# Patient Record
Sex: Male | Born: 1940 | Race: White | Hispanic: No | Marital: Married | Smoking: Former smoker
Health system: Southern US, Community
[De-identification: ages and names within clinical notes are randomized; demographics above are authoritative.]

## PROBLEM LIST (undated history)

## (undated) DIAGNOSIS — I82629 Acute embolism and thrombosis of deep veins of unspecified upper extremity: Secondary | ICD-10-CM

## (undated) DIAGNOSIS — C760 Malignant neoplasm of head, face and neck: Secondary | ICD-10-CM

## (undated) DIAGNOSIS — I729 Aneurysm of unspecified site: Secondary | ICD-10-CM

## (undated) DIAGNOSIS — Z85118 Personal history of other malignant neoplasm of bronchus and lung: Secondary | ICD-10-CM

## (undated) DIAGNOSIS — K449 Diaphragmatic hernia without obstruction or gangrene: Secondary | ICD-10-CM

## (undated) DIAGNOSIS — C3490 Malignant neoplasm of unspecified part of unspecified bronchus or lung: Secondary | ICD-10-CM

## (undated) DIAGNOSIS — E7800 Pure hypercholesterolemia, unspecified: Secondary | ICD-10-CM

## (undated) DIAGNOSIS — I10 Essential (primary) hypertension: Secondary | ICD-10-CM

## (undated) DIAGNOSIS — J439 Emphysema, unspecified: Secondary | ICD-10-CM

## (undated) DIAGNOSIS — R918 Other nonspecific abnormal finding of lung field: Secondary | ICD-10-CM

## (undated) DIAGNOSIS — C779 Secondary and unspecified malignant neoplasm of lymph node, unspecified: Secondary | ICD-10-CM

## (undated) DIAGNOSIS — G4734 Idiopathic sleep related nonobstructive alveolar hypoventilation: Secondary | ICD-10-CM

## (undated) DIAGNOSIS — I739 Peripheral vascular disease, unspecified: Secondary | ICD-10-CM

## (undated) DIAGNOSIS — E119 Type 2 diabetes mellitus without complications: Secondary | ICD-10-CM

## (undated) DIAGNOSIS — I7101 Dissection of ascending aorta: Secondary | ICD-10-CM

## (undated) DIAGNOSIS — IMO0001 Reserved for inherently not codable concepts without codable children: Secondary | ICD-10-CM

## (undated) DIAGNOSIS — IMO0002 Reserved for concepts with insufficient information to code with codable children: Secondary | ICD-10-CM

## (undated) DIAGNOSIS — E785 Hyperlipidemia, unspecified: Secondary | ICD-10-CM

## (undated) DIAGNOSIS — Z952 Presence of prosthetic heart valve: Secondary | ICD-10-CM

## (undated) DIAGNOSIS — I714 Abdominal aortic aneurysm, without rupture: Secondary | ICD-10-CM

## (undated) DIAGNOSIS — Z85819 Personal history of malignant neoplasm of unspecified site of lip, oral cavity, and pharynx: Secondary | ICD-10-CM

## (undated) HISTORY — DX: Personal history of malignant neoplasm of unspecified site of lip, oral cavity, and pharynx: Z85.819

## (undated) HISTORY — PX: LUNG SURGERY: SHX703

## (undated) HISTORY — DX: Pure hypercholesterolemia, unspecified: E78.00

## (undated) HISTORY — DX: Type 2 diabetes mellitus without complications: E11.9

## (undated) HISTORY — PX: TONSILECTOMY/ADENOIDECTOMY WITH MYRINGOTOMY: SHX6125

## (undated) HISTORY — DX: Emphysema, unspecified: J43.9

## (undated) HISTORY — DX: Reserved for concepts with insufficient information to code with codable children: IMO0002

## (undated) HISTORY — PX: TUMOR REMOVAL: SHX12

## (undated) HISTORY — DX: Hyperlipidemia, unspecified: E78.5

## (undated) HISTORY — DX: Idiopathic sleep related nonobstructive alveolar hypoventilation: G47.34

## (undated) HISTORY — DX: Other nonspecific abnormal finding of lung field: R91.8

## (undated) HISTORY — DX: Personal history of other malignant neoplasm of bronchus and lung: Z85.118

## (undated) HISTORY — DX: Essential (primary) hypertension: I10

## (undated) HISTORY — PX: TONSILLECTOMY: SUR1361

## (undated) HISTORY — DX: Diaphragmatic hernia without obstruction or gangrene: K44.9

## (undated) HISTORY — PX: CARDIAC VALVE SURGERY: SHX40

## (undated) HISTORY — PX: AORTIC VALVE REPLACEMENT: SHX41

## (undated) HISTORY — DX: Malignant neoplasm of unspecified part of unspecified bronchus or lung: C34.90

## (undated) HISTORY — DX: Peripheral vascular disease, unspecified: I73.9

## (undated) HISTORY — PX: OTHER SURGICAL HISTORY: SHX169

## (undated) HISTORY — DX: Malignant neoplasm of head, face and neck: C76.0

## (undated) HISTORY — DX: Presence of prosthetic heart valve: Z95.2

## (undated) HISTORY — DX: Secondary and unspecified malignant neoplasm of lymph node, unspecified: C77.9

## (undated) HISTORY — DX: Reserved for inherently not codable concepts without codable children: IMO0001

## (undated) HISTORY — DX: Abdominal aortic aneurysm, without rupture: I71.4

---

## 2004-01-17 ENCOUNTER — Ambulatory Visit: Payer: Self-pay | Admitting: Family Medicine

## 2004-02-06 ENCOUNTER — Ambulatory Visit: Payer: Self-pay | Admitting: Family Medicine

## 2004-02-15 ENCOUNTER — Ambulatory Visit: Payer: Self-pay | Admitting: Family Medicine

## 2004-02-20 ENCOUNTER — Ambulatory Visit: Payer: Self-pay | Admitting: Family Medicine

## 2004-03-19 ENCOUNTER — Ambulatory Visit: Payer: Self-pay | Admitting: Family Medicine

## 2005-09-07 ENCOUNTER — Ambulatory Visit: Payer: Self-pay | Admitting: Oncology

## 2005-10-05 ENCOUNTER — Encounter: Payer: Self-pay | Admitting: Internal Medicine

## 2005-12-29 ENCOUNTER — Ambulatory Visit: Payer: Self-pay | Admitting: Oncology

## 2006-03-16 ENCOUNTER — Ambulatory Visit: Payer: Self-pay | Admitting: Oncology

## 2006-05-18 ENCOUNTER — Ambulatory Visit: Payer: Self-pay | Admitting: Oncology

## 2006-08-10 ENCOUNTER — Ambulatory Visit: Payer: Self-pay | Admitting: Oncology

## 2006-11-02 ENCOUNTER — Ambulatory Visit: Payer: Self-pay | Admitting: Oncology

## 2006-12-24 ENCOUNTER — Encounter: Payer: Self-pay | Admitting: Internal Medicine

## 2007-03-01 ENCOUNTER — Ambulatory Visit: Payer: Self-pay | Admitting: Cardiothoracic Surgery

## 2007-03-10 ENCOUNTER — Encounter: Payer: Self-pay | Admitting: Internal Medicine

## 2007-03-23 ENCOUNTER — Ambulatory Visit: Payer: Self-pay | Admitting: Cardiothoracic Surgery

## 2007-05-30 ENCOUNTER — Encounter: Payer: Self-pay | Admitting: Internal Medicine

## 2007-12-08 ENCOUNTER — Ambulatory Visit: Payer: Self-pay | Admitting: Internal Medicine

## 2007-12-08 DIAGNOSIS — E119 Type 2 diabetes mellitus without complications: Secondary | ICD-10-CM

## 2007-12-08 DIAGNOSIS — I10 Essential (primary) hypertension: Secondary | ICD-10-CM | POA: Insufficient documentation

## 2007-12-08 DIAGNOSIS — E785 Hyperlipidemia, unspecified: Secondary | ICD-10-CM | POA: Insufficient documentation

## 2007-12-08 DIAGNOSIS — R0602 Shortness of breath: Secondary | ICD-10-CM | POA: Insufficient documentation

## 2007-12-08 HISTORY — DX: Hyperlipidemia, unspecified: E78.5

## 2008-01-13 ENCOUNTER — Ambulatory Visit: Payer: Self-pay | Admitting: Internal Medicine

## 2008-01-13 DIAGNOSIS — J449 Chronic obstructive pulmonary disease, unspecified: Secondary | ICD-10-CM

## 2008-03-15 ENCOUNTER — Ambulatory Visit: Payer: Self-pay | Admitting: Internal Medicine

## 2009-01-10 ENCOUNTER — Ambulatory Visit: Payer: Self-pay | Admitting: Cardiothoracic Surgery

## 2009-01-15 ENCOUNTER — Ambulatory Visit (HOSPITAL_COMMUNITY): Admission: RE | Admit: 2009-01-15 | Discharge: 2009-01-15 | Payer: Self-pay | Admitting: Cardiothoracic Surgery

## 2009-01-17 ENCOUNTER — Encounter: Admission: RE | Admit: 2009-01-17 | Discharge: 2009-01-17 | Payer: Self-pay | Admitting: Cardiothoracic Surgery

## 2009-01-17 ENCOUNTER — Ambulatory Visit: Payer: Self-pay | Admitting: Cardiothoracic Surgery

## 2009-01-28 ENCOUNTER — Ambulatory Visit: Payer: Self-pay | Admitting: Cardiothoracic Surgery

## 2009-03-06 ENCOUNTER — Ambulatory Visit: Payer: Self-pay | Admitting: Cardiothoracic Surgery

## 2009-03-14 ENCOUNTER — Ambulatory Visit: Payer: Self-pay | Admitting: Cardiothoracic Surgery

## 2009-05-16 ENCOUNTER — Encounter: Admission: RE | Admit: 2009-05-16 | Discharge: 2009-05-16 | Payer: Self-pay | Admitting: Cardiothoracic Surgery

## 2009-05-23 ENCOUNTER — Ambulatory Visit: Payer: Self-pay | Admitting: Cardiothoracic Surgery

## 2009-05-30 ENCOUNTER — Ambulatory Visit: Payer: Self-pay | Admitting: Cardiothoracic Surgery

## 2009-06-05 ENCOUNTER — Ambulatory Visit: Payer: Self-pay | Admitting: Cardiothoracic Surgery

## 2009-06-05 ENCOUNTER — Ambulatory Visit (HOSPITAL_COMMUNITY): Admission: RE | Admit: 2009-06-05 | Discharge: 2009-06-05 | Payer: Self-pay | Admitting: Cardiothoracic Surgery

## 2009-06-18 ENCOUNTER — Ambulatory Visit (HOSPITAL_COMMUNITY): Admission: RE | Admit: 2009-06-18 | Discharge: 2009-06-18 | Payer: Self-pay | Admitting: Oncology

## 2010-02-12 ENCOUNTER — Encounter: Payer: Self-pay | Admitting: Internal Medicine

## 2010-02-13 ENCOUNTER — Ambulatory Visit: Payer: Self-pay | Admitting: Internal Medicine

## 2010-02-13 DIAGNOSIS — J8410 Pulmonary fibrosis, unspecified: Secondary | ICD-10-CM

## 2010-03-20 ENCOUNTER — Ambulatory Visit
Admission: RE | Admit: 2010-03-20 | Discharge: 2010-03-20 | Payer: Self-pay | Source: Home / Self Care | Attending: Internal Medicine | Admitting: Internal Medicine

## 2010-03-20 ENCOUNTER — Encounter: Payer: Self-pay | Admitting: Internal Medicine

## 2010-03-20 DIAGNOSIS — R930 Abnormal findings on diagnostic imaging of skull and head, not elsewhere classified: Secondary | ICD-10-CM | POA: Insufficient documentation

## 2010-04-03 NOTE — Assessment & Plan Note (Signed)
Summary: Pulmonary/ ext ov with hfa/ dpi teaching, restart spiriva   Copy to:  Dr. Josiah Lobo Primary Provider/Referring Provider:  Dr. Elray Mcgregor  CC:  PNA- seen at [facility] in [place].  History of Present Illness: 70 yowm quit smoking  since 2003 due to sob and coughing s/p partial right lower lobe resection at [facility] March 2009 for ca   December 08, 2007 pulmonary consultation for doe x walking across the yard, uphill coming back to the house slowly without stopping until back to house x > 1 year, assoc with hoarseness and day >> night dry cough,  no better on various nebulizers, may have tried spiriva but not sure so rx with spiriva  January 13, 2008 returns with improved ex tol, no increase cough.  worked on inhaler technique  March 15, 2008 ov alot better in terms of ex tolerance, rarely uses xopenex. no cough. stopped spiriva subsequent to ov due to cost.  February 13, 2010 ov cc doe  x 1 year esp p RT and chemo last rx  Summer 2011 doe x morning routine [employer] on bugging, does not use hc parking,  uses 02 just at night.  minimal cough/ congestion with recent flare with discolored mucus dx with possible pna 12/14 on levaquin and prednisone.   Current Medications (verified): 1)  Micardis Hct 80-25 Mg Tabs (Telmisartan-Hctz) .Marland Kitchen.. 1 Once Daily As Needed 2)  Clonazepam 1 Mg Tabs (Clonazepam) .Marland Kitchen.. 1 Two Times A Day As Needed 3)  Simvastatin 40 Mg Tabs (Simvastatin) .Marland Kitchen.. 1 Once Daily 4)  Aspirin 325 Mg Tabs (Aspirin) .Marland Kitchen.. 1 Once Daily 5)  Perforomist 20 Mcg/50ml Nebu (Formoterol Fumarate) .Marland Kitchen.. 1 in Nebulizer Two Times A Day 6)  Pulmicort 0.5 Mg/51ml Susp (Budesonide) .Marland Kitchen.. 1 in Nebulizer Two Times A Day 7)  Xopenex Hfa 45 Mcg/act Aero (Levalbuterol Tartrate) .... 2 Puffs Every 4 Hours If Needed For Short of Breath 8)  Digoxin 0.125 Mg Tabs (Digoxin) .Marland Kitchen.. 1 Once Daily 9)  Metoprolol Tartrate 25 Mg Tabs (Metoprolol Tartrate) .... 1/2 Two Times A Day 10)  Prednisone 10 Mg Tabs  (Prednisone) .... Tapered Dose Per Dr Manson Passey 11)  Levaquin 500 Mg Tabs (Levofloxacin) .... As Directed Per Dr Manson Passey 12)  Hydrocodone-Homatropine 5-1.5 Mg Tabs (Hydrocodone-Homatropine) .Marland Kitchen.. 1 Tsp 4 Times Per Day As Needed  Allergies (verified): No Known Drug Allergies  Past History:  Past Medical History:  DYSPNEA (ICD-786.05) AORTIC STENOSIS (ICD-424.1)    - Echo 03/10/2007 mod calcific aortic stenosis with mild regurg, nl LA DIABETES MELLITUS, TYPE II (ICD-250.00) DYSLIPIDEMIA (ICD-272.4) ESSENTIAL HYPERTENSION (ICD-401.9) COPD     - 70 yowm quit smoking technique 01/13/08     - PFT's March 15, 2008 FEV1 1.31 (46%) ratio 36 ,  with 14 % response to B2,  54% DLC0    - Restart spiriva February 13, 2010 x 30 day trial >>> Obesity      - Target wt  = 190   for BMI < 30   Vital Signs:  Patient profile:   70 year old male Weight:      198.25 pounds BMI:     30.25 O2 Sat:      92 % on Room air Temp:     97.5 degrees F oral Pulse rate:   110 / minute BP sitting:   108 / 60  (left arm)  Vitals Entered By: Vernie Murders (February 13, 2010 11:52 AM)  O2 Flow:  Room air  Physical Exam  Additional Exam:  in general he is an ambulatory obese white male nad wt 207 December 08, 2007  > 208 11.13.09 > 203 March 15, 2008  > 203  HEENT mild turbinate edema.  Oropharynx full dentures no thrush or excess pnd or cobblestoning.  No JVD or cervical adenopathy. Mild accessory muscle hypertrophy. Trachea midline, nl thryroid. Chest was hyperinflated by percussion with diminished breath sounds and moderate increased exp time without wheeze. Hoover sign positive at mid inspiration. Regular rate and rhythm with II-II/VI systolic ejection murmur, but no gallop or rub or increase P2.  Abd: no hsm, nl excursion. Ext warm without C,C or E.     CXR  Procedure date:  02/12/2010  Findings:      Mod size HH ? rml infiltrat and chronic undrlying PF  Impression & Recommendations:  Problem # 1:  COPD  UNSPECIFIED (ICD-496) DDX of  difficult airways managment all start with A and  include Adherence, Ace Inhibitors, Acid Reflux, Active Sinus Disease, Alpha 1 Antitripsin deficiency, Anxiety masquerading as Airways dz,  ABPA,  allergy(esp in young), Aspiration (esp in elderly), Adverse effects of DPI,  Active smokers, plus one B  = Beta blocker use..    Adherence very questionable, limited insight into meds. Needs rechallenge with spiriva.  I spent extra time with the patient today explaining optimal  dpi  technique.  This improved from  50-90%  ? acid reflux consider adding next ov   Each maintenance medication was reviewed in detail including most importantly the difference between maintenance and as needed and under what circumstances the prns are to be used.   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 related to ALI or RT/ chemo, needs f/u cxr and pft's.   CAP covered with levaquiin  Medications Added to Medication List This Visit: 1)  Simvastatin 40 Mg Tabs (Simvastatin) .Marland Kitchen.. 1 once daily 2)  Aspirin 325 Mg Tabs (Aspirin) .Marland Kitchen.. 1 once daily 3)  Digoxin 0.125 Mg Tabs (Digoxin) .Marland Kitchen.. 1 once daily 4)  Metoprolol Tartrate 25 Mg Tabs (Metoprolol tartrate) .... 1/2 two times a day 5)  Prednisone 10 Mg Tabs (Prednisone) .... Tapered dose per dr brown 6)  Levaquin 500 Mg Tabs (Levofloxacin) .... As directed per dr brown 7)  Hydrocodone-homatropine 5-1.5 Mg Tabs (Hydrocodone-homatropine) .Marland Kitchen.. 1 tsp 4 times per day as needed 8)  Spiriva Handihaler 18 Mcg Caps (Tiotropium bromide monohydrate) .... Two puffs in handihaler daily  Other Orders: Est. Patient Level IV (81191) HFA Instruction [phone])  Patient Instructions: 1)  Bring all your medications in two bags, the ones you use no matter what vs the ones you take only if you feel need them 2)  Spiriva one capsule each am  3)  Please schedule a follow-up appointment  in 4 weeks, sooner if needed cxr and pft's on return

## 2010-04-03 NOTE — Miscellaneous (Signed)
Summary: Orders Update pft charges   Clinical Lists Changes  Orders: Added new Service order of Carbon Monoxide diffusing w/capacity (94729) - Signed Added new Service order of Lung Volumes/Gas dilution or washout (94727) - Signed Added new Service order of Spirometry (Pre & Post) (94060) - Signed 

## 2010-04-03 NOTE — Assessment & Plan Note (Addendum)
Summary: Pulmonary/ ext f/u ov with hfa 90% p coaching   Copy to:  Dr. Josiah Lobo Primary Provider/Referring Provider:  Dr. Elray Mcgregor  CC:  Dyspnea- slightly better.  History of Present Illness: 31 yowm quit smoking  since 2003 due to sob and coughing s/p partial right lower lobe resection at [facility] March 2009 for ca with recurrence actively being treated  in [place] with chemo and RT  December 08, 2007 pulmonary consultation for doe x walking across the yard, uphill coming back to the house slowly without stopping until back to house x > 1 year, assoc with hoarseness and day >> night dry cough,  no better on various nebulizers, may have tried spiriva but not sure so rx with spiriva  January 13, 2008 returns with improved ex tol, no increase cough.  worked on inhaler technique  March 15, 2008 ov alot better in terms of ex tolerance, rarely uses xopenex. no cough. stopped spiriva subsequent to ov due to cost.  February 13, 2010 ov cc doe  x 1 year esp p RT and chemo last rx  Summer 2011 doe x morning routine [employer] on bugging, does not use hc parking,  uses 02 just at night.  minimal cough/ congestion with recent flare with discolored mucus dx with possible pna 12/14 on levaquin and prednisone.  rec rechallenge with spiriva since helped in past  March 20, 2010 ov doe slt better x does ok at [employer] as long as takes his time.  Pt denies any significant sore throat, dysphagia, itching, sneezing,  nasal congestion or excess secretions,  fever, chills, sweats, unintended wt loss, pleuritic or exertional cp, hempoptysis, change in activity tolerance  orthopnea pnd or leg swelling. Pt also denies any obvious fluctuation in symptoms with weather or environmental change or other alleviating or aggravating factors.       Current Medications (verified): 1)  Micardis Hct 80-25 Mg Tabs (Telmisartan-Hctz) .Marland Kitchen.. 1 Once Daily As Needed 2)  Clonazepam 1 Mg Tabs (Clonazepam) .Marland Kitchen.. 1 Two Times A  Day As Needed 3)  Crestor 10 Mg Tabs (Rosuvastatin Calcium) .Marland Kitchen.. 1 Once Daily 4)  Aspirin 325 Mg Tabs (Aspirin) .Marland Kitchen.. 1 Once Daily 5)  Perforomist 20 Mcg/55ml Nebu (Formoterol Fumarate) .Marland Kitchen.. 1 in Nebulizer Two Times A Day 6)  Pulmicort 0.5 Mg/83ml Susp (Budesonide) .Marland Kitchen.. 1 in Nebulizer Two Times A Day 7)  Xopenex Hfa 45 Mcg/act Aero (Levalbuterol Tartrate) .... 2 Puffs Every 4 Hours If Needed For Short of Breath 8)  Digoxin 0.125 Mg Tabs (Digoxin) .Marland Kitchen.. 1 Once Daily 9)  Metoprolol Tartrate 25 Mg Tabs (Metoprolol Tartrate) .... 1/2 Two Times A Day 10)  Spiriva Handihaler 18 Mcg  Caps (Tiotropium Bromide Monohydrate) .... Two Puffs in Handihaler Daily  Allergies (verified): No Known Drug Allergies  Past History:  Past Medical History:  DYSPNEA (ICD-786.05) AORTIC STENOSIS (ICD-424.1)    - Echo 03/10/2007 mod calcific aortic stenosis with mild regurg, nl LA DIABETES MELLITUS, TYPE II (ICD-250.00) DYSLIPIDEMIA (ICD-272.4) ESSENTIAL HYPERTENSION (ICD-401.9) COPD     - Mastered hfa technique 01/13/08     - PFT's March 15, 2008 FEV1 1.31 (46%) ratio 36 ,  with 14 % response to B2,  54% DLC0    - Restart spiriva February 13, 2010 x 30 day trial >>> better    -PFT's 01/19/11:  FEV1 1.22 (44%) ratio 35 no better with B2 and DLCO 56 corrects to 72% Lung ca, squamous cell     - s/p wedge resection  [facility] March 2009     - Recurrence 2011 rx in [place], RT and Chemo Obesity      - Target wt  = 190   for BMI < 30   Vital Signs:  Patient profile:   70 year old male Weight:      198 pounds O2 Sat:      91 % on Room air Temp:     97.7 degrees F oral Pulse rate:   105 / minute BP sitting:   110 / 78  (right arm)  Vitals Entered By: Vernie Murders (March 20, 2010 11:04 AM)  O2 Flow:  Room air  Physical Exam  Additional Exam:  in general he is an ambulatory obese white male nad wt 207 December 08, 2007  > 208 11.13.09 > 203 March 15, 2008  >  198 March 20, 2010  HEENT mild turbinate edema.   Oropharynx full dentures no thrush or excess pnd or cobblestoning.  No JVD or cervical adenopathy. Mild accessory muscle hypertrophy. Trachea midline, nl thryroid. Chest was hyperinflated by percussion with diminished breath sounds and moderate increased exp time without wheeze. Hoover sign positive at mid inspiration. Regular rate and rhythm with II-II/VI systolic ejection murmur, but no gallop or rub or increase P2.  Abd: no hsm, nl excursion. Ext warm without C,C or E.     Impression & Recommendations:  Problem # 1:  COPD UNSPECIFIED (ICD-496) GOLD III but really no change since previous study 2 years ago   DDX of  difficult airways managment all start with A and  include Adherence, Ace Inhibitors, Acid Reflux, Active Sinus Disease, Alpha 1 Antitripsin deficiency, Anxiety masquerading as Airways dz,  ABPA,  allergy(esp in young), Aspiration (esp in elderly), Adverse effects of DPI,  Active smokers, plus one B  = Beta blocker use..    Adherence always the biggest obstacle.  I had an extended discussion with the patient today lasting 15 to 20 minutes of a 25 minute visit on the following issues:  a) I spent extra time with the patient today explaining optimal mdi  technique.  This improved from  50-90% b) Each maintenance medication was reviewed in detail including most importantly the difference between maintenance and as needed and under what circumstances the prns are to be used.   Problem # 2:  ABNORMAL LUNG XRAY (ICD-793.1) New density in area of previous wedge resection  for Ca  (RLL sup segment) worrisome for recurrence but not a candidate for further surgery - tells me he has known recurrent dz and being actively treated in [place].  This is not clinically pna but could be an area of RT fibrosis, since relatively localized would follow serially.   Medications Added to Medication List This Visit: 1)  Crestor 10 Mg Tabs (Rosuvastatin calcium) .Marland Kitchen.. 1 once daily 2)  Xopenex Hfa 45 Mcg/act  Aero (Levalbuterol tartrate) .... 2 puffs every 4 hours if needed for short of breath  Other Orders: Est. Patient Level IV (16109) HFA Instruction [phone]) Prescription Created Electronically [phone]) T-2 View CXR ([id number])  Patient Instructions: 1)  xopenox 2 puffs every 4 hours if needed for short of breath instead of combivent which conflicts with the spiriva 2)  Work on inhaler technique:  relax and blow all the way out then take a nice smooth deep breath back in, triggering the inhaler at same time you start breathing in  3)  Return to office in 3 months, sooner if needed  Prescriptions: XOPENEX  HFA 45 MCG/ACT AERO (LEVALBUTEROL TARTRATE) 2 puffs every 4 hours if needed for short of breath  #1 x 11   Entered and Authorized by:   Nyoka Cowden MD   Signed by:   Nyoka Cowden MD on 03/20/2010   Method used:   Electronically to        [facility] [phone]* (retail)       [address]       [place], [place]  [address]       Ph: [phone] or [phone]       Fax: [phone]   RxID:   [id number]

## 2010-04-23 ENCOUNTER — Telehealth (INDEPENDENT_AMBULATORY_CARE_PROVIDER_SITE_OTHER): Payer: Self-pay | Admitting: *Deleted

## 2010-04-29 NOTE — Progress Notes (Signed)
Summary: rx request- LMTCB x 1--atc pt line busy  Phone Note Call from Patient Call back at Home Phone [phone]   Caller: Spouse Doris Call For: wert Summary of Call: requests rx for spiriva handihaler[facility] [address] [facility] [place].  Initial call taken by: Tivis Ringer, CNA,  April 23, 2010 3:06 PM  Follow-up for Phone Call        [facility] Yetta Barre RN  April 23, 2010 4:15 PM  atc pt multiple times but line was busy. WCB later [facility]  April 23, 2010 5:02 PM      Appended Document: rx request- LMTCB x 1--atc pt line busy Rx was sent to pharm.  Pt's spouse made aware.

## 2010-05-20 LAB — GLUCOSE, CAPILLARY: Glucose-Capillary: 100 mg/dL — ABNORMAL HIGH (ref 70–99)

## 2010-05-21 LAB — CBC
HCT: 41.7 % (ref 39.0–52.0)
Hemoglobin: 14.1 g/dL (ref 13.0–17.0)
MCHC: 33.8 g/dL (ref 30.0–36.0)
MCV: 94.7 fL (ref 78.0–100.0)
Platelets: 131 10*3/uL — ABNORMAL LOW (ref 150–400)
RBC: 4.4 MIL/uL (ref 4.22–5.81)
RDW: 14.7 % (ref 11.5–15.5)
WBC: 8.1 10*3/uL (ref 4.0–10.5)

## 2010-05-21 LAB — COMPREHENSIVE METABOLIC PANEL
ALT: 16 U/L (ref 0–53)
AST: 20 U/L (ref 0–37)
Albumin: 3.8 g/dL (ref 3.5–5.2)
Alkaline Phosphatase: 90 U/L (ref 39–117)
BUN: 5 mg/dL — ABNORMAL LOW (ref 6–23)
CO2: 32 mEq/L (ref 19–32)
Calcium: 9.6 mg/dL (ref 8.4–10.5)
Chloride: 105 mEq/L (ref 96–112)
Creatinine, Ser: 0.9 mg/dL (ref 0.4–1.5)
GFR calc Af Amer: >60 mL/min (ref >60)
GFR calc non Af Amer: >60 mL/min (ref >60)
Glucose, Bld: 152 mg/dL — ABNORMAL HIGH (ref 70–99)
Potassium: 4.3 mEq/L (ref 3.5–5.1)
Sodium: 140 mEq/L (ref 135–145)
Total Bilirubin: 0.6 mg/dL (ref 0.3–1.2)
Total Protein: 6.7 g/dL (ref 6.0–8.3)

## 2010-05-21 LAB — PROTIME-INR
INR: 1.06 (ref 0.00–1.49)
Prothrombin Time: 13.7 seconds (ref 11.6–15.2)

## 2010-05-21 LAB — APTT: aPTT: 27 seconds (ref 24–37)

## 2010-05-21 LAB — GLUCOSE, CAPILLARY: Glucose-Capillary: 123 mg/dL — ABNORMAL HIGH (ref 70–99)

## 2010-06-04 LAB — GLUCOSE, CAPILLARY: Glucose-Capillary: 105 mg/dL — ABNORMAL HIGH (ref 70–99)

## 2010-07-15 NOTE — Assessment & Plan Note (Signed)
OFFICE VISIT   Tommy [name], Tommy [name]  DOB:  04-11-1940                                        May 23, 2009  CHART #:  [id number]   The patient returns to the office today after a followup CT scan of the  chest.  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  He  represented with critical aortic stenosis with underlying pulmonary  disease with extreme heart failure and ultimately underwent aortic valve  replacement with a 23 pericardial tissue valve on January 28, 2010.  Prior to this, a MRI was done in [facility] that raised the issue of  possible mediastinal recurrence or enlarged lymph node in the right in  the right hilum.  PET scan showed a small area of increased uptake but  it was not associated with an enlarged lymph node.  At that point, it  was unclear if the patient had recurrence and was decided to proceed  with his valve replacement and then followup CT scan to do 3 months  later.  The patient returns today with the CT.  He notes that initially  he had improvement in his overall pulmonary symptoms after a valve  replacement, but he still uses oxygen at night and has limited pulmonary  reserve.  He has had no hemoptysis.   On examination, his blood pressure is 140/83, pulse is 100, respiratory  rate is 18, O2 sats 92% on room air.  His heart valve sounds are crisp  without any murmur of aortic insufficiency.  His lungs are clear  bilaterally.  He has no pedal edema.   A CT scan is performed that when compared to the PET scan appears that  there is a right hilar node that has increased slightly in size.  Unfortunately, the patient's other scans were done and MRI were done in  [facility] and also in [place].  I have discussed with the patient the  likelihood that he does have recurrence in the right hilum and will  present his case at the [facility] Multidisciplinary Thoracic  Oncology Clinic  conference and he will return to see me next week.  During that time, I  will try to obtain copies of the outside CTs to compare with his current  situation.  At this point, I suspect he will need a bronch and possible  EBUS to obtain a tissue  diagnosis to confirm a recurrence, and then if it is positive for  recurrence consideration for radiation therapy and/or chemotherapy.   Sheliah Plane, MD  Electronically Signed   EG/MEDQ  D:  05/23/2009  T:  05/24/2009  Job:  [id number]   cc:   Aundra Dubin. Revankar, M.D.  [place] Kirby Funk, MD

## 2010-07-15 NOTE — Assessment & Plan Note (Signed)
OFFICE VISIT   Tommy [name], Tommy [name]  DOB:  January 16, 1941                                        March 14, 2009  CHART #:  [id number]   The patient returns to the office today.  He now approximately 5 weeks  after aortic valve replacement with a pericardial tissue filled with  Magna Ease 23 mm, done on January 28, 2009.  The patient has  significant underlying pulmonary disease, on home oxygen.  In spite of  this, he has made a good recovery postoperatively.  He notes that his  ability to walk and exert himself is much improved.  He had a recent  office visit in [facility] and had to walk to the X-ray to get a chest x-  ray and then to the office.  He notes that prior to surgery he would not  have been able to even do this.   On exam today, his blood pressure is 133/84, pulse is 72, respiratory  rate is 18, and O2 sats 92%.  His sternum is stable and well healed.  Bowel sounds are crisp.  His lungs have distant breath sounds  bilaterally, but without wheezing.  His lower extremities are without  edema.   He continues on aspirin, Combivent nebulizers, Micardis 80/25,  simvastatin 40 mg a day, clonazepam, budesonide 0.5 mg nebulizer twice a  day.  He stopped his Spiriva, continues on amiodarone 200 mg every other  day when he runs out of the prescription, he will discontinue this per  Dr. Tomie China, metoprolol 12.5 q.8 h., hydrocodone as needed, and digoxin  0.125.   Preoperatively, we had obtained a PET scan on the patient to follow up  after a diagnosis of lung cancer.  A right perihilar lymph node was  slightly elevated in uptake with the PET scan, but was not enlarged by  CT which leaves a dilemma of this is significant or not.  At this point,  the patient is doing well postoperatively.  After reviewing his scans in  the Multidisciplinary Thoracic Oncology Clinic, I have recommended to  him that we obtain a followup CT scan in 2 months compared to the scan 2  months ago and see if there is any enlargement of the associated nodes  and at that time consider EBUS.  The position of the node would be  difficult with mediastinoscopy and with the patient's underlying  pulmonary disease, thoracoscopic attempts of  biopsy would carry some significant risks.  The patient is agreeable  with this approach and we will plan to see him back in 2 months with a  followup CT scan.   Sheliah Plane, MD  Electronically Signed   EG/MEDQ  D:  03/14/2009  T:  03/15/2009  Job:  [id number]   cc:   Weston Settle, MD  Aundra Dubin Revankar, M.D.

## 2010-07-15 NOTE — Consult Note (Signed)
NEW PATIENT CONSULTATION   [name], [name]  DOB:  05/01/1940                                        March 01, 2007  CHART #:  [id number]   FOLLOWUP CARDIOLOGIST:  Aundra Dubin. Revankar, MD--[place]   PRIMARY CARE PHYSICIAN:  Dr. Valda Favia   The patient also had a radical left neck dissection by Dr. Lendell Caprice,  [facility] September 2007.   REASON FOR CONSULTATION:  Right lung lesion.   HISTORY OF PRESENT ILLNESS:  The patient is a 70 year old male with  known significant pulmonary disease on home oxygen at night, who had  presented in June of 2007 with a left cervical lymph node, is identified  as squamous cell carcinoma.  Subsequently he underwent tonsillectomy  which showed no definite lesion, and ultimately underwent left radical  neck dissection by Dr. Lendell Caprice in [place] in September of 2007.  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  The  patient is referred for consideration of surgical resection.   PAST MEDICAL HISTORY:  The patient has a history of hypertension,  hyperlipidemia, denies diabetes, is a remote smoker.  Smoked for 45  years.  Quit 5 years ago.   FAMILY HISTORY:  Significant for a brain tumor in his father at age 42,  dementia, and acute myocardial infarction at age 19 in his mother.  One  brother died of emphysema and diabetes at 80.  Three brothers alive.  Three sisters are alive.  The patient does have severe COPD.  Screening  pulmonary function showed an FEV1 of 1.1.  Denies renal insufficiency.   PAST MEDICAL HISTORY:  He notes that he has a history of a heart murmur.  No history of myocardial infarction.   PREVIOUS SURGERY:  Left radical neck and tonsillectomy.   The  patient is married, retired, lives with his wife.   MEDICATIONS:  Include:  1. Home oxygen at night for nocturnal desaturations.  2. Promist inhaler 20 mcg twice a day.  3. Ipratropium bromide 0.02%, 4 puffs a day.  4. Pulmicort Respules 0.5 mg twice a day.  5. Albuterol as needed.  6. Micardis HCT 80/25.  7. Lipitor 20 mg a day.  8. Clorazepam 1 mg twice a day.  9. Aspirin 81 mg a day.   DRUG ALLERGIES:  NONE KNOWN.   REVIEW OF SYSTEMS:  CARDIAC:  Positive for exertional shortness of  breath.  Denies resting shortness of breath.  Denies orthopnea,  presyncope, syncope, palpitations, lower extremity edema.  GENERAL:  Weight has been stable.  Does have fatigue, especially with exertion.  No change in vision.  Denies chest pain.  Does have shortness of breath.  Denies hemoptysis.  Does have wheezing.  Does have cough.  Denies  gallstones.  Denies any change in bowel habits.  Denies melena.  Does  have headaches.  Denies psychiatric history.  Denies polyuria,  polydipsia.  Other review of systems are negative.   PHYSICAL EXAMINATION:  The patient's blood pressure 153/91.  Pulse is  107.  Respiratory  rate is 18.  O2 sat is 94%.  He is 5 feet 8 inches  tall, 202 pounds.  The patient has the body habitus of severe COPD with  increased AP chest diameter and dyspneic while just at rest in the  office.  He has well-healed incisions of the left radical neck incision,  left radical neck dissection.  There are no palpable cervical, or  supraclavicular lymph nodes.  On auscultation of the lungs he has  expiratory wheezing heard anteriorly, but otherwise very distant breath  sounds with little air movement.  CARDIAC:  Reveals early systolic murmur, but difficult to hear due to  the muffled heart tones.  ABDOMEN:  Shows large umbilical hernia.  He has a known abdominal aortic  aneurysm of 4 cm, but it is not palpable with his moderately obese  abdomen.  LOWER EXTREMITIES:  Without significant  edema.  He does have palpable  distal PT and DP pulses.   PET scan and CT [facility] are  reviewed.  The patient has 1 irregular mass of approximately 1.4 cm  pleural-based in the right lower lobe posteriorly that is positive of  PET scan.  There are no other areas that are positive on PET scan.  This  is the area that was biopsied and found to be squamous.   IMPRESSION:  Probable second primary, from scans appears to be T1,N0  lesion and ideally would be treated with surgical resection.  However,  the patient also has calcifications of his coronary arteries, probably  high risk for having coronary disease.  Has known significant pulmonary  disease on home oxygen and with a screening pulmonary function tests of  FEV1 of 1.  Prior to making a final decision about surgery, will arrange  for the patient to be seen by Dr. Tomie China for cardiac clearance,  possibly with echocardiogram and Cardiolite stress test.  In addition,  will obtain full pulmonary function test studies including defusion  capacity and blood gas.  Following this evaluation I will further  discuss with him the risks and options of surgery.  If his pulmonary  function tests are reasonable, one approach would be a wedge resection  with placement of radiation seeds.  I have discussed this with the  patient, but will make a final decision pending the remaining  information.   Sheliah Plane, MD  Electronically Signed   EG/MEDQ  D:  03/01/2007  T:  03/02/2007  Job:  [id number]   cc:   Weston Settle, MD  Aundra Dubin Revankar, M.D.

## 2010-07-18 NOTE — H&P (Signed)
HISTORY AND PHYSICAL EXAMINATION   January 18, 2009   Re:  Tommy [name], Tommy [name]           DOB:  15-Apr-1940   CHIEF COMPLAINT:  Critical aortic stenosis and question of recurrent  carcinoma of the lung.   HISTORY OF PRESENT ILLNESS:  The patient is a 70 year old male who I  originally saw in December 2008.  At that time, he had significant  pulmonary disease, was on home oxygen at night, and had a history of  cervical lymph node with squamous cell carcinoma.  He underwent a  tonsillectomy and radical neck dissection in September 2007 and has been  followed without obvious recurrence in this area.  In 2008 when I saw  him, a 2- x 1.4-cm mass with an SUV of 5.7 was in his right lower lobe.  Needle biopsy confirmed squamous cell carcinoma.  The patient also has  history of known critical aortic stenosis.  He was sent to the [facility] for a CyberKnife radiation therapy on the lung lesion.  Instead the  patient describes a shaving of the tumor off the lung was performed  instead.  He now presents with increasing symptoms of critical aortic  stenosis with valve area on echocardiogram in April of 0.5 with an peak  gradient of 77 and mean gradient of 47.  He recently underwent cardiac  catheterization and was referred back for a question of critical aortic  stenosis and suitability for aortic valve replacement.  In spite of the  patient's history, he remains active, though he is limited by exertional  shortness of breath.  Recent cardiac catheterization was performed, and  pulmonary function studies have been performed.  After I saw him last  week, a PET scan was done to further evaluate the possibility of  widespread metastatic disease prior to any surgery.   Past medical history is positive for hypertension, hyperlipidemia.  Denies diabetes.  Is a remote smoker, quit 7 years ago.  Has small  bilateral iliac aneurysms.  Has history of squamous cell carcinoma of  the neck  really with unidentified primary without evidence of  recurrence, squamous cell carcinoma of the lung with recent PET scan  that suggests a possible node in the right hilum that is hypermetabolic  without any tissue diagnosis.  He has a known large gastric hiatal  hernia, history of chronic obstructive pulmonary disease.   FAMILY HISTORY:  Father died of brain tumor at age 38.  Mother died at  age 64 with dementia and acute myocardial infarction.  One brother died  of emphysema and diabetes at 64.  Three brothers are alive.  Three  sisters are alive.   SOCIAL HISTORY:  The patient is married, retired, lives with his wife.   CURRENT MEDICATIONS:  1. Aspirin 81 mg a day.  2. Perforomist 20 mcg nebulizer q.12 h.  3. Combivent 2 puffs q.i.d. p.r.n.  4. Micardis HCT 80/25, the patient takes this p.r.n.  5. Simvastatin 40 mg a day.  6. Clonazepam 1 mg b.i.d.  7. Budesonide 0.5 mg nebulizer twice a day.  8. Spiriva 18 mcg once a day.   ALLERGIES:  None known.   PHYSICAL EXAMINATION:  The patient is awake, alert, neurologically  intact, and has an O2 sat of 94% on room air.  His blood pressure is  132/80, pulse is 105, respiratory rate is 18.  The patient has no  carotid bruits.  His breath sounds are distant but without  wheezing.  He  has a harsh holosystolic murmur consistent with severe aortic stenosis.  Abdominal exam shows moderate obesity without palpable organomegaly.  The known iliac aneurysms are not palpable secondary to his obesity.  He  has no pedal edema.   DIAGNOSTIC TESTS:  Cardiac catheterization films are reviewed and showed  luminal irregularities but no high-grade obstructive disease.  Echocardiogram done in April shows aortic valve area of 0.5.  Pulmonary  function studies done at [facility] show an FEV-1 of 0.91,  diffusion capacity in the 55-47% range.  A PET scan done at [facility]  and a MRI both suggest single right hilar lymph node that may be   suspicious for malignancy, has an SUV of 5, and just marginally  enlarged.  Cardiac catheterization was performed by Dr. Reuel Boom at [facility] confirming small aneurysms of both common iliac  arteries, small-to-moderate size aneurysm in the infrarenal abdominal  aorta, mild nonobstructive coronary disease is noted.   IMPRESSION:  The patient who is with known significant pulmonary disease  has progressive symptoms of severe aortic stenosis but remains active  and functional even considering his pulmonary disease with question of  possible recurrence of his carcinoma of the lung with area of right  hilar PET scan activity.  I have discussed with the patient the various  options, but it appears that he is now in a state where although he  attempts to remain active his critical aortic stenosis is his major  limiting factor and may in fact be cause of his demise prior to anything  else.  At this point, I recommend that we proceed with aortic valve  replacement and then very soon after this process perhaps 3-4 weeks  perform an EBUS and try to obtain a tissue diagnosis of the right hilar  node.  The area of activity would be difficult to reach at the time of  his aortic valve surgery and I am somewhat concerned about proceeding  with general anesthesia and EBUS with the critical nature of his aortic  stenosis without relieving it.  The risks and options of his complex  case have been discussed in detail with he and his wife.  We will obtain  a CT scan of the head to rule out metastatic disease to the brain.  If  the node in the right hilum does improve to be positive with his aortic  valve replaced, his overall functional status should be improved and he  appeared to undergo radiation and possible chemotherapy for this.  The  patient understands his increased risk of operative procedure because of  his underlying pulmonary disease but is willing to proceed because of  his  very limiting symptoms at this point.  I have arranged for surgery  at [facility] on Monday, January 28, 2009.   Sheliah Plane, MD  Electronically Signed   EG/MEDQ  D:  01/18/2009  T:  01/19/2009  Job:  [id number]   cc:   Aundra Dubin. Revankar, M.D.  Lanell Matar, MD  [place] Funk, MD

## 2010-10-06 ENCOUNTER — Telehealth: Payer: Self-pay | Admitting: Internal Medicine

## 2010-10-06 NOTE — Telephone Encounter (Signed)
Spoke with pharmacist. She states that pt brought in old rx for spiriva that read 2 puffs daily. I advised to change wording to inhale contents of 1 capsule daily. I advised to cancel the 2 remaining refills on the rx, as the pt is non compliant and needs to sched appt. She verbalized understanding and states that she will inform the pt to call for appt.

## 2010-11-25 ENCOUNTER — Emergency Department (HOSPITAL_COMMUNITY)
Admission: EM | Admit: 2010-11-25 | Discharge: 2010-11-25 | Disposition: A | Discharge status: Home / Self Care | Payer: Medicare Other | Attending: Emergency Medicine | Admitting: Emergency Medicine

## 2010-11-25 DIAGNOSIS — I714 Abdominal aortic aneurysm, without rupture, unspecified: Secondary | ICD-10-CM | POA: Insufficient documentation

## 2010-11-25 DIAGNOSIS — Z79899 Other long term (current) drug therapy: Secondary | ICD-10-CM | POA: Insufficient documentation

## 2010-11-25 DIAGNOSIS — J438 Other emphysema: Secondary | ICD-10-CM | POA: Insufficient documentation

## 2010-11-25 DIAGNOSIS — R109 Unspecified abdominal pain: Secondary | ICD-10-CM | POA: Insufficient documentation

## 2010-11-25 DIAGNOSIS — Z87891 Personal history of nicotine dependence: Secondary | ICD-10-CM | POA: Insufficient documentation

## 2010-11-25 DIAGNOSIS — I10 Essential (primary) hypertension: Secondary | ICD-10-CM | POA: Insufficient documentation

## 2010-11-25 DIAGNOSIS — Z85118 Personal history of other malignant neoplasm of bronchus and lung: Secondary | ICD-10-CM | POA: Insufficient documentation

## 2010-12-29 ENCOUNTER — Other Ambulatory Visit: Payer: Self-pay | Admitting: Internal Medicine

## 2011-03-21 ENCOUNTER — Other Ambulatory Visit: Payer: Self-pay | Admitting: Internal Medicine

## 2011-04-14 DIAGNOSIS — J209 Acute bronchitis, unspecified: Secondary | ICD-10-CM | POA: Diagnosis not present

## 2011-05-02 ENCOUNTER — Other Ambulatory Visit: Payer: Self-pay | Admitting: Internal Medicine

## 2011-05-13 ENCOUNTER — Other Ambulatory Visit: Payer: Self-pay | Admitting: Internal Medicine

## 2011-05-14 ENCOUNTER — Other Ambulatory Visit: Payer: Self-pay | Admitting: Internal Medicine

## 2011-05-18 DIAGNOSIS — Z79899 Other long term (current) drug therapy: Secondary | ICD-10-CM | POA: Diagnosis not present

## 2011-05-18 DIAGNOSIS — E7800 Pure hypercholesterolemia, unspecified: Secondary | ICD-10-CM | POA: Diagnosis not present

## 2011-05-18 DIAGNOSIS — F411 Generalized anxiety disorder: Secondary | ICD-10-CM | POA: Diagnosis not present

## 2011-05-18 DIAGNOSIS — J449 Chronic obstructive pulmonary disease, unspecified: Secondary | ICD-10-CM | POA: Diagnosis not present

## 2011-05-18 DIAGNOSIS — R7309 Other abnormal glucose: Secondary | ICD-10-CM | POA: Diagnosis not present

## 2011-06-03 DIAGNOSIS — N12 Tubulo-interstitial nephritis, not specified as acute or chronic: Secondary | ICD-10-CM | POA: Diagnosis not present

## 2011-06-03 DIAGNOSIS — I959 Hypotension, unspecified: Secondary | ICD-10-CM | POA: Diagnosis not present

## 2011-06-03 DIAGNOSIS — R350 Frequency of micturition: Secondary | ICD-10-CM | POA: Diagnosis not present

## 2011-06-03 DIAGNOSIS — R000 Tachycardia, unspecified: Secondary | ICD-10-CM | POA: Diagnosis not present

## 2011-06-03 DIAGNOSIS — D6959 Other secondary thrombocytopenia: Secondary | ICD-10-CM | POA: Diagnosis not present

## 2011-06-03 DIAGNOSIS — R300 Dysuria: Secondary | ICD-10-CM | POA: Diagnosis not present

## 2011-06-03 DIAGNOSIS — J449 Chronic obstructive pulmonary disease, unspecified: Secondary | ICD-10-CM | POA: Diagnosis not present

## 2011-06-03 DIAGNOSIS — N3000 Acute cystitis without hematuria: Secondary | ICD-10-CM | POA: Diagnosis not present

## 2011-06-03 DIAGNOSIS — A498 Other bacterial infections of unspecified site: Secondary | ICD-10-CM | POA: Diagnosis not present

## 2011-06-03 DIAGNOSIS — R509 Fever, unspecified: Secondary | ICD-10-CM | POA: Diagnosis not present

## 2011-06-03 DIAGNOSIS — N10 Acute tubulo-interstitial nephritis: Secondary | ICD-10-CM | POA: Diagnosis not present

## 2011-06-03 DIAGNOSIS — N390 Urinary tract infection, site not specified: Secondary | ICD-10-CM | POA: Diagnosis not present

## 2011-06-03 DIAGNOSIS — Z9981 Dependence on supplemental oxygen: Secondary | ICD-10-CM | POA: Diagnosis not present

## 2011-06-03 DIAGNOSIS — A419 Sepsis, unspecified organism: Secondary | ICD-10-CM | POA: Diagnosis not present

## 2011-06-04 DIAGNOSIS — N400 Enlarged prostate without lower urinary tract symptoms: Secondary | ICD-10-CM | POA: Diagnosis not present

## 2011-06-04 DIAGNOSIS — R9431 Abnormal electrocardiogram [ECG] [EKG]: Secondary | ICD-10-CM | POA: Diagnosis not present

## 2011-06-04 DIAGNOSIS — Z87891 Personal history of nicotine dependence: Secondary | ICD-10-CM | POA: Diagnosis not present

## 2011-06-04 DIAGNOSIS — J4489 Other specified chronic obstructive pulmonary disease: Secondary | ICD-10-CM | POA: Diagnosis not present

## 2011-06-04 DIAGNOSIS — R509 Fever, unspecified: Secondary | ICD-10-CM | POA: Diagnosis not present

## 2011-06-04 DIAGNOSIS — I10 Essential (primary) hypertension: Secondary | ICD-10-CM | POA: Diagnosis present

## 2011-06-04 DIAGNOSIS — M545 Low back pain: Secondary | ICD-10-CM | POA: Diagnosis not present

## 2011-06-04 DIAGNOSIS — Z8589 Personal history of malignant neoplasm of other organs and systems: Secondary | ICD-10-CM | POA: Diagnosis not present

## 2011-06-04 DIAGNOSIS — Z9981 Dependence on supplemental oxygen: Secondary | ICD-10-CM | POA: Diagnosis not present

## 2011-06-04 DIAGNOSIS — N12 Tubulo-interstitial nephritis, not specified as acute or chronic: Secondary | ICD-10-CM | POA: Diagnosis not present

## 2011-06-04 DIAGNOSIS — A419 Sepsis, unspecified organism: Secondary | ICD-10-CM | POA: Diagnosis not present

## 2011-06-04 DIAGNOSIS — Z79899 Other long term (current) drug therapy: Secondary | ICD-10-CM | POA: Diagnosis not present

## 2011-06-04 DIAGNOSIS — N390 Urinary tract infection, site not specified: Secondary | ICD-10-CM | POA: Diagnosis not present

## 2011-06-04 DIAGNOSIS — A498 Other bacterial infections of unspecified site: Secondary | ICD-10-CM | POA: Diagnosis present

## 2011-06-04 DIAGNOSIS — D6959 Other secondary thrombocytopenia: Secondary | ICD-10-CM | POA: Diagnosis present

## 2011-06-04 DIAGNOSIS — Z7982 Long term (current) use of aspirin: Secondary | ICD-10-CM | POA: Diagnosis not present

## 2011-06-04 DIAGNOSIS — Z902 Acquired absence of lung [part of]: Secondary | ICD-10-CM | POA: Diagnosis not present

## 2011-06-04 DIAGNOSIS — R350 Frequency of micturition: Secondary | ICD-10-CM | POA: Diagnosis not present

## 2011-06-04 DIAGNOSIS — R5383 Other fatigue: Secondary | ICD-10-CM | POA: Diagnosis not present

## 2011-06-04 DIAGNOSIS — Z85118 Personal history of other malignant neoplasm of bronchus and lung: Secondary | ICD-10-CM | POA: Diagnosis not present

## 2011-06-04 DIAGNOSIS — R300 Dysuria: Secondary | ICD-10-CM | POA: Diagnosis not present

## 2011-06-04 DIAGNOSIS — I959 Hypotension, unspecified: Secondary | ICD-10-CM | POA: Diagnosis not present

## 2011-06-04 DIAGNOSIS — Z954 Presence of other heart-valve replacement: Secondary | ICD-10-CM | POA: Diagnosis not present

## 2011-06-04 DIAGNOSIS — E785 Hyperlipidemia, unspecified: Secondary | ICD-10-CM | POA: Diagnosis present

## 2011-06-04 DIAGNOSIS — J449 Chronic obstructive pulmonary disease, unspecified: Secondary | ICD-10-CM | POA: Diagnosis not present

## 2011-06-04 DIAGNOSIS — Z859 Personal history of malignant neoplasm, unspecified: Secondary | ICD-10-CM | POA: Diagnosis not present

## 2011-06-17 DIAGNOSIS — N390 Urinary tract infection, site not specified: Secondary | ICD-10-CM | POA: Diagnosis not present

## 2011-06-18 DIAGNOSIS — I10 Essential (primary) hypertension: Secondary | ICD-10-CM | POA: Diagnosis not present

## 2011-06-18 DIAGNOSIS — E785 Hyperlipidemia, unspecified: Secondary | ICD-10-CM | POA: Diagnosis not present

## 2011-06-18 DIAGNOSIS — I714 Abdominal aortic aneurysm, without rupture: Secondary | ICD-10-CM | POA: Diagnosis not present

## 2011-06-18 DIAGNOSIS — J449 Chronic obstructive pulmonary disease, unspecified: Secondary | ICD-10-CM | POA: Diagnosis not present

## 2011-07-04 ENCOUNTER — Other Ambulatory Visit: Payer: Self-pay | Admitting: Internal Medicine

## 2011-08-19 DIAGNOSIS — J449 Chronic obstructive pulmonary disease, unspecified: Secondary | ICD-10-CM | POA: Diagnosis not present

## 2011-08-19 DIAGNOSIS — F411 Generalized anxiety disorder: Secondary | ICD-10-CM | POA: Diagnosis not present

## 2011-08-19 DIAGNOSIS — Z79899 Other long term (current) drug therapy: Secondary | ICD-10-CM | POA: Diagnosis not present

## 2011-08-19 DIAGNOSIS — E7800 Pure hypercholesterolemia, unspecified: Secondary | ICD-10-CM | POA: Diagnosis not present

## 2011-08-19 DIAGNOSIS — I10 Essential (primary) hypertension: Secondary | ICD-10-CM | POA: Diagnosis not present

## 2011-08-19 DIAGNOSIS — R7309 Other abnormal glucose: Secondary | ICD-10-CM | POA: Diagnosis not present

## 2011-08-29 ENCOUNTER — Other Ambulatory Visit: Payer: Self-pay | Admitting: Internal Medicine

## 2011-09-02 ENCOUNTER — Other Ambulatory Visit: Payer: Self-pay | Admitting: Internal Medicine

## 2011-11-05 DIAGNOSIS — E7800 Pure hypercholesterolemia, unspecified: Secondary | ICD-10-CM | POA: Diagnosis not present

## 2011-11-05 DIAGNOSIS — F411 Generalized anxiety disorder: Secondary | ICD-10-CM | POA: Diagnosis not present

## 2011-11-05 DIAGNOSIS — I10 Essential (primary) hypertension: Secondary | ICD-10-CM | POA: Diagnosis not present

## 2011-11-05 DIAGNOSIS — Z23 Encounter for immunization: Secondary | ICD-10-CM | POA: Diagnosis not present

## 2011-11-05 DIAGNOSIS — J449 Chronic obstructive pulmonary disease, unspecified: Secondary | ICD-10-CM | POA: Diagnosis not present

## 2011-11-05 DIAGNOSIS — Z79899 Other long term (current) drug therapy: Secondary | ICD-10-CM | POA: Diagnosis not present

## 2011-11-05 DIAGNOSIS — R7309 Other abnormal glucose: Secondary | ICD-10-CM | POA: Diagnosis not present

## 2011-12-23 DIAGNOSIS — J449 Chronic obstructive pulmonary disease, unspecified: Secondary | ICD-10-CM | POA: Diagnosis not present

## 2011-12-23 DIAGNOSIS — I10 Essential (primary) hypertension: Secondary | ICD-10-CM | POA: Diagnosis not present

## 2011-12-23 DIAGNOSIS — E785 Hyperlipidemia, unspecified: Secondary | ICD-10-CM | POA: Diagnosis not present

## 2012-01-06 DIAGNOSIS — J441 Chronic obstructive pulmonary disease with (acute) exacerbation: Secondary | ICD-10-CM | POA: Diagnosis not present

## 2012-01-20 DIAGNOSIS — C3410 Malignant neoplasm of upper lobe, unspecified bronchus or lung: Secondary | ICD-10-CM | POA: Diagnosis not present

## 2012-01-20 DIAGNOSIS — C3430 Malignant neoplasm of lower lobe, unspecified bronchus or lung: Secondary | ICD-10-CM | POA: Diagnosis not present

## 2012-01-22 DIAGNOSIS — Z85118 Personal history of other malignant neoplasm of bronchus and lung: Secondary | ICD-10-CM | POA: Diagnosis not present

## 2012-01-22 DIAGNOSIS — Z09 Encounter for follow-up examination after completed treatment for conditions other than malignant neoplasm: Secondary | ICD-10-CM | POA: Diagnosis not present

## 2012-02-15 DIAGNOSIS — Z79899 Other long term (current) drug therapy: Secondary | ICD-10-CM | POA: Diagnosis not present

## 2012-02-15 DIAGNOSIS — E7800 Pure hypercholesterolemia, unspecified: Secondary | ICD-10-CM | POA: Diagnosis not present

## 2012-02-15 DIAGNOSIS — F411 Generalized anxiety disorder: Secondary | ICD-10-CM | POA: Diagnosis not present

## 2012-02-15 DIAGNOSIS — R7309 Other abnormal glucose: Secondary | ICD-10-CM | POA: Diagnosis not present

## 2012-02-15 DIAGNOSIS — J449 Chronic obstructive pulmonary disease, unspecified: Secondary | ICD-10-CM | POA: Diagnosis not present

## 2012-02-15 DIAGNOSIS — I10 Essential (primary) hypertension: Secondary | ICD-10-CM | POA: Diagnosis not present

## 2012-02-19 DIAGNOSIS — J209 Acute bronchitis, unspecified: Secondary | ICD-10-CM | POA: Diagnosis not present

## 2012-02-22 DIAGNOSIS — J209 Acute bronchitis, unspecified: Secondary | ICD-10-CM | POA: Diagnosis not present

## 2012-02-22 DIAGNOSIS — K449 Diaphragmatic hernia without obstruction or gangrene: Secondary | ICD-10-CM | POA: Diagnosis not present

## 2012-02-22 DIAGNOSIS — R0609 Other forms of dyspnea: Secondary | ICD-10-CM | POA: Diagnosis not present

## 2012-02-22 DIAGNOSIS — R0989 Other specified symptoms and signs involving the circulatory and respiratory systems: Secondary | ICD-10-CM | POA: Diagnosis not present

## 2012-02-22 DIAGNOSIS — J449 Chronic obstructive pulmonary disease, unspecified: Secondary | ICD-10-CM | POA: Diagnosis not present

## 2012-02-22 DIAGNOSIS — J9801 Acute bronchospasm: Secondary | ICD-10-CM | POA: Diagnosis not present

## 2012-02-22 DIAGNOSIS — R0602 Shortness of breath: Secondary | ICD-10-CM | POA: Diagnosis not present

## 2012-02-22 DIAGNOSIS — R0789 Other chest pain: Secondary | ICD-10-CM | POA: Diagnosis not present

## 2012-03-03 DIAGNOSIS — J209 Acute bronchitis, unspecified: Secondary | ICD-10-CM | POA: Diagnosis not present

## 2012-03-27 DIAGNOSIS — J209 Acute bronchitis, unspecified: Secondary | ICD-10-CM | POA: Diagnosis not present

## 2012-03-28 DIAGNOSIS — J209 Acute bronchitis, unspecified: Secondary | ICD-10-CM | POA: Diagnosis not present

## 2012-03-28 DIAGNOSIS — J449 Chronic obstructive pulmonary disease, unspecified: Secondary | ICD-10-CM | POA: Diagnosis not present

## 2012-04-01 DIAGNOSIS — R0609 Other forms of dyspnea: Secondary | ICD-10-CM | POA: Diagnosis not present

## 2012-04-01 DIAGNOSIS — J180 Bronchopneumonia, unspecified organism: Secondary | ICD-10-CM | POA: Diagnosis not present

## 2012-04-01 DIAGNOSIS — J1100 Influenza due to unidentified influenza virus with unspecified type of pneumonia: Secondary | ICD-10-CM | POA: Diagnosis not present

## 2012-04-01 DIAGNOSIS — R059 Cough, unspecified: Secondary | ICD-10-CM | POA: Diagnosis not present

## 2012-04-01 DIAGNOSIS — K219 Gastro-esophageal reflux disease without esophagitis: Secondary | ICD-10-CM | POA: Diagnosis not present

## 2012-04-01 DIAGNOSIS — J111 Influenza due to unidentified influenza virus with other respiratory manifestations: Secondary | ICD-10-CM | POA: Diagnosis not present

## 2012-04-01 DIAGNOSIS — J9600 Acute respiratory failure, unspecified whether with hypoxia or hypercapnia: Secondary | ICD-10-CM | POA: Diagnosis not present

## 2012-04-01 DIAGNOSIS — C3490 Malignant neoplasm of unspecified part of unspecified bronchus or lung: Secondary | ICD-10-CM | POA: Diagnosis not present

## 2012-04-01 DIAGNOSIS — Z9981 Dependence on supplemental oxygen: Secondary | ICD-10-CM | POA: Diagnosis not present

## 2012-04-01 DIAGNOSIS — Z79899 Other long term (current) drug therapy: Secondary | ICD-10-CM | POA: Diagnosis not present

## 2012-04-01 DIAGNOSIS — I739 Peripheral vascular disease, unspecified: Secondary | ICD-10-CM | POA: Diagnosis not present

## 2012-04-01 DIAGNOSIS — R5383 Other fatigue: Secondary | ICD-10-CM | POA: Diagnosis not present

## 2012-04-01 DIAGNOSIS — R05 Cough: Secondary | ICD-10-CM | POA: Diagnosis not present

## 2012-04-01 DIAGNOSIS — R0989 Other specified symptoms and signs involving the circulatory and respiratory systems: Secondary | ICD-10-CM | POA: Diagnosis not present

## 2012-04-01 DIAGNOSIS — I119 Hypertensive heart disease without heart failure: Secondary | ICD-10-CM | POA: Diagnosis not present

## 2012-04-01 DIAGNOSIS — Z87891 Personal history of nicotine dependence: Secondary | ICD-10-CM | POA: Diagnosis not present

## 2012-04-01 DIAGNOSIS — Z954 Presence of other heart-valve replacement: Secondary | ICD-10-CM | POA: Diagnosis not present

## 2012-04-01 DIAGNOSIS — R5381 Other malaise: Secondary | ICD-10-CM | POA: Diagnosis not present

## 2012-04-01 DIAGNOSIS — Z902 Acquired absence of lung [part of]: Secondary | ICD-10-CM | POA: Diagnosis not present

## 2012-04-01 DIAGNOSIS — Z85118 Personal history of other malignant neoplasm of bronchus and lung: Secondary | ICD-10-CM | POA: Diagnosis not present

## 2012-04-01 DIAGNOSIS — E785 Hyperlipidemia, unspecified: Secondary | ICD-10-CM | POA: Diagnosis not present

## 2012-04-01 DIAGNOSIS — J9620 Acute and chronic respiratory failure, unspecified whether with hypoxia or hypercapnia: Secondary | ICD-10-CM | POA: Diagnosis not present

## 2012-04-01 DIAGNOSIS — R0602 Shortness of breath: Secondary | ICD-10-CM | POA: Diagnosis not present

## 2012-04-01 DIAGNOSIS — E7800 Pure hypercholesterolemia, unspecified: Secondary | ICD-10-CM | POA: Diagnosis not present

## 2012-04-01 DIAGNOSIS — J441 Chronic obstructive pulmonary disease with (acute) exacerbation: Secondary | ICD-10-CM | POA: Diagnosis not present

## 2012-04-12 DIAGNOSIS — J111 Influenza due to unidentified influenza virus with other respiratory manifestations: Secondary | ICD-10-CM | POA: Diagnosis not present

## 2012-04-12 DIAGNOSIS — F5102 Adjustment insomnia: Secondary | ICD-10-CM | POA: Diagnosis not present

## 2012-04-12 DIAGNOSIS — J180 Bronchopneumonia, unspecified organism: Secondary | ICD-10-CM | POA: Diagnosis not present

## 2012-04-29 DIAGNOSIS — R0602 Shortness of breath: Secondary | ICD-10-CM | POA: Diagnosis not present

## 2012-04-29 DIAGNOSIS — J189 Pneumonia, unspecified organism: Secondary | ICD-10-CM | POA: Diagnosis not present

## 2012-04-29 DIAGNOSIS — F5102 Adjustment insomnia: Secondary | ICD-10-CM | POA: Diagnosis not present

## 2012-04-29 DIAGNOSIS — J441 Chronic obstructive pulmonary disease with (acute) exacerbation: Secondary | ICD-10-CM | POA: Diagnosis not present

## 2012-04-29 DIAGNOSIS — R05 Cough: Secondary | ICD-10-CM | POA: Diagnosis not present

## 2012-04-29 DIAGNOSIS — R059 Cough, unspecified: Secondary | ICD-10-CM | POA: Diagnosis not present

## 2012-05-03 DIAGNOSIS — J189 Pneumonia, unspecified organism: Secondary | ICD-10-CM | POA: Diagnosis not present

## 2012-05-16 DIAGNOSIS — I739 Peripheral vascular disease, unspecified: Secondary | ICD-10-CM | POA: Diagnosis not present

## 2012-05-16 DIAGNOSIS — I714 Abdominal aortic aneurysm, without rupture: Secondary | ICD-10-CM | POA: Diagnosis not present

## 2012-05-16 DIAGNOSIS — I719 Aortic aneurysm of unspecified site, without rupture: Secondary | ICD-10-CM | POA: Diagnosis not present

## 2012-05-16 DIAGNOSIS — I70219 Atherosclerosis of native arteries of extremities with intermittent claudication, unspecified extremity: Secondary | ICD-10-CM | POA: Diagnosis not present

## 2012-07-28 DIAGNOSIS — R062 Wheezing: Secondary | ICD-10-CM | POA: Diagnosis not present

## 2012-07-28 DIAGNOSIS — J441 Chronic obstructive pulmonary disease with (acute) exacerbation: Secondary | ICD-10-CM | POA: Diagnosis not present

## 2012-08-01 DIAGNOSIS — J441 Chronic obstructive pulmonary disease with (acute) exacerbation: Secondary | ICD-10-CM | POA: Diagnosis not present

## 2012-08-08 DIAGNOSIS — C3430 Malignant neoplasm of lower lobe, unspecified bronchus or lung: Secondary | ICD-10-CM | POA: Diagnosis not present

## 2012-08-09 DIAGNOSIS — Z09 Encounter for follow-up examination after completed treatment for conditions other than malignant neoplasm: Secondary | ICD-10-CM | POA: Diagnosis not present

## 2012-08-09 DIAGNOSIS — Z85118 Personal history of other malignant neoplasm of bronchus and lung: Secondary | ICD-10-CM | POA: Diagnosis not present

## 2012-08-22 DIAGNOSIS — IMO0001 Reserved for inherently not codable concepts without codable children: Secondary | ICD-10-CM | POA: Diagnosis not present

## 2012-08-22 DIAGNOSIS — J449 Chronic obstructive pulmonary disease, unspecified: Secondary | ICD-10-CM | POA: Diagnosis not present

## 2012-08-22 DIAGNOSIS — E7800 Pure hypercholesterolemia, unspecified: Secondary | ICD-10-CM | POA: Diagnosis not present

## 2012-08-22 DIAGNOSIS — F329 Major depressive disorder, single episode, unspecified: Secondary | ICD-10-CM | POA: Diagnosis not present

## 2012-08-22 DIAGNOSIS — Z79899 Other long term (current) drug therapy: Secondary | ICD-10-CM | POA: Diagnosis not present

## 2012-08-22 DIAGNOSIS — R7309 Other abnormal glucose: Secondary | ICD-10-CM | POA: Diagnosis not present

## 2012-08-22 DIAGNOSIS — I10 Essential (primary) hypertension: Secondary | ICD-10-CM | POA: Diagnosis not present

## 2012-09-21 DIAGNOSIS — F411 Generalized anxiety disorder: Secondary | ICD-10-CM | POA: Diagnosis not present

## 2012-09-21 DIAGNOSIS — J449 Chronic obstructive pulmonary disease, unspecified: Secondary | ICD-10-CM | POA: Diagnosis not present

## 2012-09-21 DIAGNOSIS — F528 Other sexual dysfunction not due to a substance or known physiological condition: Secondary | ICD-10-CM | POA: Diagnosis not present

## 2012-11-01 DIAGNOSIS — E785 Hyperlipidemia, unspecified: Secondary | ICD-10-CM | POA: Diagnosis not present

## 2012-11-01 DIAGNOSIS — I10 Essential (primary) hypertension: Secondary | ICD-10-CM | POA: Diagnosis not present

## 2012-11-01 DIAGNOSIS — I359 Nonrheumatic aortic valve disorder, unspecified: Secondary | ICD-10-CM | POA: Diagnosis not present

## 2012-11-01 DIAGNOSIS — J449 Chronic obstructive pulmonary disease, unspecified: Secondary | ICD-10-CM | POA: Diagnosis not present

## 2012-11-16 ENCOUNTER — Institutional Professional Consult (permissible substitution): Payer: Medicare Other | Admitting: Internal Medicine

## 2012-11-22 DIAGNOSIS — I714 Abdominal aortic aneurysm, without rupture: Secondary | ICD-10-CM | POA: Diagnosis not present

## 2012-11-22 DIAGNOSIS — I739 Peripheral vascular disease, unspecified: Secondary | ICD-10-CM | POA: Diagnosis not present

## 2012-11-28 ENCOUNTER — Encounter: Payer: Self-pay | Admitting: Internal Medicine

## 2012-11-28 ENCOUNTER — Ambulatory Visit (INDEPENDENT_AMBULATORY_CARE_PROVIDER_SITE_OTHER): Payer: Medicare Other | Admitting: Internal Medicine

## 2012-11-28 VITALS — BP 112/70 | HR 72 | Temp 97.1°F | Ht 68.0 in | Wt 188.2 lb

## 2012-11-28 DIAGNOSIS — R0602 Shortness of breath: Secondary | ICD-10-CM

## 2012-11-28 DIAGNOSIS — J449 Chronic obstructive pulmonary disease, unspecified: Secondary | ICD-10-CM

## 2012-11-28 MED ORDER — ACLIDINIUM BROMIDE 400 MCG/ACT IN AEPB: 1.0000 | INHALATION_SPRAY | Freq: Two times a day (BID) | RESPIRATORY_TRACT | Status: DC

## 2012-11-28 NOTE — Progress Notes (Signed)
Subjective:    Patient ID: Tommy [name], male    DOB: 06-07-1940  o  MRN: [id number]  HPI  67 yowm quit smoking since 2003 due to sob and coughing   Problem # 1: COPD UNSPECIFIED (ICD-496)  GOLD III but really no change since previous study 2010 Problem # 2: ABNORMAL LUNG XRAY (ICD-793.1)  New density in area of previous wedge resection for Ca (RLL sup segment) worrisome for recurrence but not a candidate for further surgery - tells me he has known recurrent dz and being actively treated in [place]. This is not clinically pna but could be an area of RT fibrosis, since relatively localized     December 08, 2007 pulmonary consultation for doe x walking across the yard, uphill coming back to the house slowly without stopping until back to house x > 1 year, assoc with hoarseness and day >> night dry cough, no better on various nebulizers, may have tried spiriva but not sure so rx with spiriva  January 13, 2008 returns with improved ex tol, no increase cough. worked on inhaler technique  March 15, 2008 ov alot better in terms of ex tolerance, rarely uses xopenex. no cough. stopped spiriva subsequent to ov due to cost.  February 13, 2010 ov cc doe x 1 year esp p RT and chemo last rx Summer 2011 doe x morning routine [facility] [employer] on bugging, does not use hc parking, uses 02 just at night. minimal cough/ congestion with recent flare with discolored mucus dx with possible pna 12/14 on levaquin and prednisone. rec rechallenge with spiriva since helped in past  March 20, 2010 ov doe slt better x does ok at [facility] as long as takes his time.  rec  No change rx    11/28/2012 f/u ov/Tommy [name] re:  Chief Complaint  Patient presents with  . Pulmonary Consult    Pt here to re establish care. He was last seen here 02-13-10.  He is c/o increased DOE over the past 2-3 months. Has difficulty walking to the mailbox and with ADL's.     No obvious day to day or daytime variabilty or assoc chronic cough or cp  or chest tightness, subjective wheeze overt sinus or hb symptoms. No unusual exp hx or h/o childhood pna/ asthma or knowledge of premature birth.  Sleeping ok without nocturnal  or early am exacerbation  of respiratory  c/o's or need for noct saba. Also denies any obvious fluctuation of symptoms with weather or environmental changes or other aggravating or alleviating factors except as outlined above   Current Medications, Allergies, Complete Past Medical History, Past Surgical History, Family History, and Social History were reviewed in [employer] record.  ROS  The following are not active complaints unless bolded sore throat, dysphagia, dental problems, itching, sneezing,  nasal congestion or excess/ purulent secretions, ear ache,   fever, chills, sweats, unintended wt loss, pleuritic or exertional cp, hemoptysis,  orthopnea pnd or leg swelling, presyncope, palpitations, heartburn, abdominal pain, anorexia, nausea, vomiting, diarrhea  or change in bowel or urinary habits, change in stools or urine, dysuria,hematuria,  rash, arthralgias, visual complaints, headache, numbness weakness or ataxia or problems with walking or coordination,  change in mood/affect or memory.           Past Medical History:  DYSPNEA (ICD-786.05)  AORTIC STENOSIS (ICD-424.1)  - Echo 03/10/2007 mod calcific aortic stenosis with mild regurg, nl LA  DIABETES MELLITUS, TYPE II (ICD-250.00)  DYSLIPIDEMIA (ICD-272.4)  ESSENTIAL HYPERTENSION (  ICD-401.9)  COPD  - Mastered hfa technique 01/13/08  - PFT's March 15, 2008 FEV1 1.31 (46%) ratio 36 , with 14 % response to B2, 54% DLC0  - Restart spiriva February 13, 2010 x 30 day trial >>> better  -PFT's 01/19/11: FEV1 1.22 (44%) ratio 35 no better with B2 and DLCO 56 corrects to 72%  Lung ca, squamous cell  - s/p wedge resection [facility] March 2009  - Recurrence 2011 rx in [place], RT and Chemo  Obesity  - Target wt = 190 for BMI < 30      .       Review of Systems  Constitutional: Negative for fever, chills, activity change, appetite change and unexpected weight change.  HENT: Positive for trouble swallowing. Negative for congestion, sore throat, rhinorrhea, sneezing, dental problem, voice change and postnasal drip.   Eyes: Negative for visual disturbance.  Respiratory: Positive for shortness of breath. Negative for cough and choking.   Cardiovascular: Negative for chest pain and leg swelling.  Gastrointestinal: Positive for abdominal pain. Negative for nausea and vomiting.  Genitourinary: Negative for difficulty urinating.  Musculoskeletal: Negative for arthralgias.  Skin: Negative for rash.  Neurological: Positive for headaches.  Psychiatric/Behavioral: Negative for behavioral problems and confusion.       Objective:   Physical Exam   in general he is an ambulatory obese white male nad   wt 207 December 08, 2007 > 208 11.13.09 > 203 March 15, 2008 > 198 March 20, 2010  > 11/28/2012 HEENT mild turbinate edema. Oropharynx full dentures no thrush or excess pnd or cobblestoning. No JVD or cervical adenopathy. Mild accessory muscle hypertrophy. Trachea midline, nl thryroid. Chest was hyperinflated by percussion with diminished breath sounds and moderate increased exp time without wheeze. Hoover sign positive at mid inspiration. Regular rate and rhythm with II-II/VI systolic ejection murmur, but no gallop or rub or increase P2. Abd: no hsm, nl excursion. Ext warm without C,C or E.     Assessment & Plan:

## 2012-11-28 NOTE — Patient Instructions (Signed)
Start tudorza one twice daily x 6 week trial  Continue perforomist and budesonide twice daily  Only use your albuterol (proair) as a rescue medication to be used if you can't catch your breath by resting or doing a relaxed purse lip breathing pattern.  - The less you use it, the better it will work when you need it. - Ok to use up to every 4 hours if you must but call for immediate appointment if use goes up over your usual need - Don't leave home without it !!  (think of it like your spare tire for your car)   Ok to use neb albuterol as a backup for your proaire up to every 4 hours if needed  Please schedule a follow up office visit in 6 weeks, call sooner if needed with pfts on return

## 2013-01-05 DIAGNOSIS — Z23 Encounter for immunization: Secondary | ICD-10-CM | POA: Diagnosis not present

## 2013-01-09 ENCOUNTER — Ambulatory Visit (INDEPENDENT_AMBULATORY_CARE_PROVIDER_SITE_OTHER): Payer: Medicare Other | Admitting: Internal Medicine

## 2013-01-09 ENCOUNTER — Ambulatory Visit (INDEPENDENT_AMBULATORY_CARE_PROVIDER_SITE_OTHER)
Admission: RE | Admit: 2013-01-09 | Discharge: 2013-01-09 | Disposition: A | Discharge status: Home / Self Care | Payer: Medicare Other | Source: Ambulatory Visit | Attending: Internal Medicine | Admitting: Internal Medicine

## 2013-01-09 ENCOUNTER — Encounter: Payer: Self-pay | Admitting: Internal Medicine

## 2013-01-09 VITALS — BP 124/86 | HR 115 | Temp 97.6°F | Ht 68.0 in | Wt 178.0 lb

## 2013-01-09 DIAGNOSIS — J4489 Other specified chronic obstructive pulmonary disease: Secondary | ICD-10-CM

## 2013-01-09 DIAGNOSIS — J449 Chronic obstructive pulmonary disease, unspecified: Secondary | ICD-10-CM

## 2013-01-09 DIAGNOSIS — Z85118 Personal history of other malignant neoplasm of bronchus and lung: Secondary | ICD-10-CM | POA: Diagnosis not present

## 2013-01-09 LAB — PULMONARY FUNCTION TEST

## 2013-01-09 NOTE — Progress Notes (Signed)
PFT done today. 

## 2013-01-09 NOTE — Progress Notes (Signed)
Subjective:    Patient ID: Tommy [name], male    DOB: 1941/02/09  o  MRN: [id number]    Brief patient profile:  32 yowm quit smoking since 2003 due to sob and coughing with GOLD III COPD documented 03/2011    History of Present Illness  Problem # 1: COPD UNSPECIFIED (ICD-496)  GOLD III but really no change since previous study 2010 Problem # 2: ABNORMAL LUNG XRAY (ICD-793.1)  New density in area of previous wedge resection for Ca (RLL sup segment) worrisome for recurrence but not a candidate for further surgery - tells me he has known recurrent dz and being actively treated in [place]. This is not clinically pna but could be an area of RT fibrosis, since relatively localized     December 08, 2007 pulmonary consultation for doe x walking across the yard, uphill coming back to the house slowly without stopping until back to house x > 1 year, assoc with hoarseness and day >> night dry cough, no better on various nebulizers, may have tried spiriva but not sure so rx with spiriva  January 13, 2008 returns with improved ex tol, no increase cough. worked on inhaler technique  March 15, 2008 ov alot better in terms of ex tolerance, rarely uses xopenex. no cough. stopped spiriva subsequent to ov due to cost.  February 13, 2010 ov cc doe x 1 year esp p RT and chemo last rx Summer 2011 doe x morning routine [facility] on bugging, does not use hc parking, uses 02 just at night. minimal cough/ congestion with recent flare with discolored mucus dx with possible pna 12/14 on levaquin and prednisone. rec rechallenge with spiriva since helped in past  March 20, 2010 ov doe slt better x does ok at [employer] as long as takes his time.  rec  No change rx    11/28/2012 f/u ov/Tommy [name] re: GOLD III COPD  Chief Complaint  Patient presents with  . Pulmonary Consult    Pt here to re establish care. He was last seen here 02-13-10.  He is c/o increased DOE over the past 2-3 months. Has difficulty walking to the  mailbox and with ADL's.  Start tudorza one twice daily x 6 week trial Continue perforomist and budesonide twice daily Only use your albuterol (proair)    Ok to use neb albuterol as a backup for your proaire up to every 4 hours if needed   01/09/2013 f/u ov/Tommy [name] re: GOLD III Chief Complaint  Patient presents with  . Follow-up    PFT done today.  Breathing doing well.  uses proair avg of twice daily but never neb, no real change in activity tol on tudorza nor less need for saba since he started it.  No obvious day to day or daytime variabilty or assoc chronic cough or cp or chest tightness, subjective wheeze overt sinus or hb symptoms. No unusual exp hx or h/o childhood pna/ asthma or knowledge of premature birth.  Sleeping ok without nocturnal  or early am exacerbation  of respiratory  c/o's or need for noct saba. Also denies any obvious fluctuation of symptoms with weather or environmental changes or other aggravating or alleviating factors except as outlined above   Current Medications, Allergies, Complete Past Medical History, Past Surgical History, Family History, and Social History were reviewed in [employer] record.  ROS  The following are not active complaints unless bolded sore throat, dysphagia, dental problems, itching, sneezing,  nasal congestion or excess/ purulent  secretions, ear ache,   fever, chills, sweats, unintended wt loss, pleuritic or exertional cp, hemoptysis,  orthopnea pnd or leg swelling, presyncope, palpitations, heartburn, abdominal pain, anorexia, nausea, vomiting, diarrhea  or change in bowel or urinary habits, change in stools or urine, dysuria,hematuria,  rash, arthralgias, visual complaints, headache, numbness weakness or ataxia or problems with walking or coordination,  change in mood/affect or memory.           Past Medical History:  DYSPNEA (ICD-786.05)  AORTIC STENOSIS (ICD-424.1)  - Echo 03/10/2007 mod calcific aortic stenosis  with mild regurg, nl LA  DIABETES MELLITUS, TYPE II (ICD-250.00)  DYSLIPIDEMIA (ICD-272.4)  ESSENTIAL HYPERTENSION (ICD-401.9)  COPD  - Mastered hfa technique 01/13/08  - PFT's March 15, 2008 FEV1 1.31 (46%) ratio 36 , with 14 % response to B2, 54% DLC0  - Restart spiriva February 13, 2010 x 30 day trial >>> better  -PFT's 01/19/11: FEV1 1.22 (44%) ratio 35 no better with B2 and DLCO 56 corrects to 72%  Lung ca, squamous cell  - s/p wedge resection [facility] March 2009  - Recurrence 2011 rx in [place], RT and Chemo  Obesity  - Target wt = 190 for BMI < 30      .            Objective:   Physical Exam   in general he is an ambulatory obese white male nad extremely hard of hearing, poor insight into meds   wt 207 December 08, 2007 > 208 11.13.09 > 203 March 15, 2008 > 198 March 20, 2010  > 01/09/2013  178 HEENT mild turbinate edema. Oropharynx full dentures no thrush or excess pnd or cobblestoning. No JVD or cervical adenopathy. Mild accessory muscle hypertrophy. Trachea midline, nl thryroid. Chest was hyperinflated by percussion with diminished breath sounds and moderate increased exp time without wheeze. Hoover sign positive at mid inspiration. Regular rate and rhythm with II-II/VI systolic ejection murmur, but no gallop or rub or increase P2. Abd: no hsm, nl excursion. Ext warm without C,C or E.      CXR  01/09/2013 :  The findings are consistent with COPD. There is stable hilar prominence on the right. There is no evidence of pneumonia nor CHF. A large hiatal hernia -partially intrathoracic stomach is stable.  Assessment & Plan:   Outpatient Encounter Prescriptions as of 01/09/2013  Medication Sig  . albuterol (PROAIR HFA) 108 (90 BASE) MCG/ACT inhaler Inhale 2 puffs into the lungs every 6 (six) hours as needed for wheezing.  Marland Kitchen albuterol (PROVENTIL) (2.5 MG/3ML) 0.083% nebulizer solution Take 2.5 mg by nebulization every 6 (six) hours as needed for wheezing or shortness  of breath.  Marland Kitchen aspirin 325 MG tablet Take 325 mg by mouth daily.  . budesonide (PULMICORT) 0.5 MG/2ML nebulizer solution Take 0.5 mg by nebulization 2 (two) times daily.  . clonazePAM (KLONOPIN) 1 MG tablet Take 1 mg by mouth 2 (two) times daily as needed for anxiety.  . digoxin (LANOXIN) 0.125 MG tablet Take 0.125 mg by mouth daily.  Marland Kitchen ezetimibe (ZETIA) 10 MG tablet Take 10 mg by mouth daily.  . formoterol (PERFOROMIST) 20 MCG/2ML nebulizer solution Take 20 mcg by nebulization 2 (two) times daily.  Marland Kitchen losartan-hydrochlorothiazide (HYZAAR) 50-12.5 MG per tablet Take 1 tablet by mouth daily as needed.  . metoprolol tartrate (LOPRESSOR) 25 MG tablet 1/2 tablet twice per day  . nitroGLYCERIN (NITROSTAT) 0.4 MG SL tablet Place 0.4 mg under the tongue every 5 (five) minutes as needed  for chest pain.  . [DISCONTINUED] Aclidinium Bromide (TUDORZA PRESSAIR) 400 MCG/ACT AEPB Inhale 1 puff into the lungs 2 (two) times daily. One twice daily

## 2013-01-09 NOTE — Patient Instructions (Addendum)
Please remember to go to the  x-ray department downstairs for your tests - we will call you with the results when they are available.  Stop the tudorza since you didn't feel it helped   Continue perforomist and budesonide twice daily  Only use your albuterol (proair) as a rescue medication to be used if you can't catch your breath by resting or doing a relaxed purse lip breathing pattern.  - The less you use it, the better it will work when you need it. - Ok to use up to every 4 hours if you must but call for immediate appointment if use goes up over your usual need - Don't leave home without it !!  (think of it like your spare tire for your car)   Ok to use neb albuterol as a backup for your proaire up to every 4 hours if needed   If you are satisfied with your treatment plan let your doctor know and he/she can either refill your medications or you can return here when your prescription runs out.     If in any way you are not 100% satisfied,  please tell us.  If 100% better, tell your friends!

## 2013-01-09 NOTE — Assessment & Plan Note (Signed)
-   PFT"s 03/20/10  FEV1 1.22 (44%) ratio 38 and DLCO 56 corrected 72% - 11/28/2012  Walked RA x 3 laps @ 185 ft each stopped due to  Sob with sats 91%   Already on max ics/ laba  Try tudorza one bid to see if improves ex tol or reduces saba rx

## 2013-01-10 NOTE — Progress Notes (Signed)
Quick Note:  Spoke with pt and notified of results per Dr. Wert. Pt verbalized understanding and denied any questions.  ______ 

## 2013-01-11 NOTE — Assessment & Plan Note (Signed)
-   PFT"s 03/20/10  FEV1 1.22 (44%) ratio 38 and DLCO 56 corrected 72% - PFT's 01/09/2013  FEV1  1.20 (41%) ratio 41 and no change p B2,  DLCO 51% corrrects to 65% - 11/28/2012  Walked RA x 3 laps @ 185 ft each stopped due to  Sob with sats 91%  - Failed tudorza  I had an extended discussion with the patient and fm today lasting 15 to 20 minutes of a 25 minute visit on the following issues:   No better on LAMA so d/c'd it.  Based on difficulty with communication with this pt and very poor insight into meds/ symptoms I doubt there's much more to offer in this clinic though he might benefit form pulmonary rehab locally     Each maintenance medication was reviewed in as much detail as I could manage given the above restrictions including most importantly the difference between maintenance and as needed and under what circumstances the prns are to be used.  Please see instructions for details which were reviewed in writing and the patient given a copy.    Pulmonary f/u can be prn

## 2013-01-11 NOTE — Assessment & Plan Note (Signed)
-   11/28/2012  Walked RA x 3 laps @ 185 ft each stopped due to end of study, sats still 91%   No need for 02 at this point

## 2013-01-19 DIAGNOSIS — L049 Acute lymphadenitis, unspecified: Secondary | ICD-10-CM | POA: Diagnosis not present

## 2013-01-19 DIAGNOSIS — J449 Chronic obstructive pulmonary disease, unspecified: Secondary | ICD-10-CM | POA: Diagnosis not present

## 2013-01-19 DIAGNOSIS — Z79899 Other long term (current) drug therapy: Secondary | ICD-10-CM | POA: Diagnosis not present

## 2013-01-19 DIAGNOSIS — R7309 Other abnormal glucose: Secondary | ICD-10-CM | POA: Diagnosis not present

## 2013-01-19 DIAGNOSIS — F528 Other sexual dysfunction not due to a substance or known physiological condition: Secondary | ICD-10-CM | POA: Diagnosis not present

## 2013-01-19 DIAGNOSIS — I10 Essential (primary) hypertension: Secondary | ICD-10-CM | POA: Diagnosis not present

## 2013-01-19 DIAGNOSIS — F329 Major depressive disorder, single episode, unspecified: Secondary | ICD-10-CM | POA: Diagnosis not present

## 2013-01-19 DIAGNOSIS — E7800 Pure hypercholesterolemia, unspecified: Secondary | ICD-10-CM | POA: Diagnosis not present

## 2013-01-23 DIAGNOSIS — C3430 Malignant neoplasm of lower lobe, unspecified bronchus or lung: Secondary | ICD-10-CM | POA: Diagnosis not present

## 2013-01-23 DIAGNOSIS — C760 Malignant neoplasm of head, face and neck: Secondary | ICD-10-CM | POA: Diagnosis not present

## 2013-01-23 DIAGNOSIS — Z09 Encounter for follow-up examination after completed treatment for conditions other than malignant neoplasm: Secondary | ICD-10-CM | POA: Diagnosis not present

## 2013-01-30 ENCOUNTER — Encounter: Payer: Self-pay | Admitting: Internal Medicine

## 2013-02-02 DIAGNOSIS — L578 Other skin changes due to chronic exposure to nonionizing radiation: Secondary | ICD-10-CM | POA: Diagnosis not present

## 2013-02-02 DIAGNOSIS — C44221 Squamous cell carcinoma of skin of unspecified ear and external auricular canal: Secondary | ICD-10-CM | POA: Diagnosis not present

## 2013-02-02 DIAGNOSIS — C44319 Basal cell carcinoma of skin of other parts of face: Secondary | ICD-10-CM | POA: Diagnosis not present

## 2013-02-02 DIAGNOSIS — L570 Actinic keratosis: Secondary | ICD-10-CM | POA: Diagnosis not present

## 2013-02-02 DIAGNOSIS — L821 Other seborrheic keratosis: Secondary | ICD-10-CM | POA: Diagnosis not present

## 2013-02-02 DIAGNOSIS — R233 Spontaneous ecchymoses: Secondary | ICD-10-CM | POA: Diagnosis not present

## 2013-02-08 DIAGNOSIS — J438 Other emphysema: Secondary | ICD-10-CM | POA: Diagnosis not present

## 2013-02-08 DIAGNOSIS — C760 Malignant neoplasm of head, face and neck: Secondary | ICD-10-CM | POA: Diagnosis not present

## 2013-02-08 DIAGNOSIS — C3430 Malignant neoplasm of lower lobe, unspecified bronchus or lung: Secondary | ICD-10-CM | POA: Diagnosis not present

## 2013-02-10 DIAGNOSIS — Z85118 Personal history of other malignant neoplasm of bronchus and lung: Secondary | ICD-10-CM | POA: Diagnosis not present

## 2013-02-10 DIAGNOSIS — Z09 Encounter for follow-up examination after completed treatment for conditions other than malignant neoplasm: Secondary | ICD-10-CM | POA: Diagnosis not present

## 2013-02-14 DIAGNOSIS — C44221 Squamous cell carcinoma of skin of unspecified ear and external auricular canal: Secondary | ICD-10-CM | POA: Diagnosis not present

## 2013-02-14 DIAGNOSIS — C44320 Squamous cell carcinoma of skin of unspecified parts of face: Secondary | ICD-10-CM | POA: Diagnosis not present

## 2013-02-14 DIAGNOSIS — L570 Actinic keratosis: Secondary | ICD-10-CM | POA: Diagnosis not present

## 2013-02-24 DIAGNOSIS — J13 Pneumonia due to Streptococcus pneumoniae: Secondary | ICD-10-CM | POA: Diagnosis not present

## 2013-02-24 DIAGNOSIS — R509 Fever, unspecified: Secondary | ICD-10-CM | POA: Diagnosis not present

## 2013-02-24 DIAGNOSIS — J441 Chronic obstructive pulmonary disease with (acute) exacerbation: Secondary | ICD-10-CM | POA: Diagnosis not present

## 2013-05-10 DIAGNOSIS — D234 Other benign neoplasm of skin of scalp and neck: Secondary | ICD-10-CM | POA: Diagnosis not present

## 2013-05-10 DIAGNOSIS — C44319 Basal cell carcinoma of skin of other parts of face: Secondary | ICD-10-CM | POA: Diagnosis not present

## 2013-05-10 DIAGNOSIS — L578 Other skin changes due to chronic exposure to nonionizing radiation: Secondary | ICD-10-CM | POA: Diagnosis not present

## 2013-05-10 DIAGNOSIS — L570 Actinic keratosis: Secondary | ICD-10-CM | POA: Diagnosis not present

## 2013-05-24 DIAGNOSIS — D1739 Benign lipomatous neoplasm of skin and subcutaneous tissue of other sites: Secondary | ICD-10-CM | POA: Diagnosis not present

## 2013-05-24 DIAGNOSIS — D485 Neoplasm of uncertain behavior of skin: Secondary | ICD-10-CM | POA: Diagnosis not present

## 2013-05-30 DIAGNOSIS — I10 Essential (primary) hypertension: Secondary | ICD-10-CM | POA: Diagnosis not present

## 2013-05-30 DIAGNOSIS — Z79899 Other long term (current) drug therapy: Secondary | ICD-10-CM | POA: Diagnosis not present

## 2013-05-30 DIAGNOSIS — J449 Chronic obstructive pulmonary disease, unspecified: Secondary | ICD-10-CM | POA: Diagnosis not present

## 2013-05-30 DIAGNOSIS — E7800 Pure hypercholesterolemia, unspecified: Secondary | ICD-10-CM | POA: Diagnosis not present

## 2013-05-30 DIAGNOSIS — F329 Major depressive disorder, single episode, unspecified: Secondary | ICD-10-CM | POA: Diagnosis not present

## 2013-05-30 DIAGNOSIS — R7309 Other abnormal glucose: Secondary | ICD-10-CM | POA: Diagnosis not present

## 2013-06-13 DIAGNOSIS — L570 Actinic keratosis: Secondary | ICD-10-CM | POA: Diagnosis not present

## 2013-08-15 DIAGNOSIS — Z85118 Personal history of other malignant neoplasm of bronchus and lung: Secondary | ICD-10-CM | POA: Diagnosis not present

## 2013-08-15 DIAGNOSIS — C3490 Malignant neoplasm of unspecified part of unspecified bronchus or lung: Secondary | ICD-10-CM | POA: Diagnosis not present

## 2013-08-15 DIAGNOSIS — Z09 Encounter for follow-up examination after completed treatment for conditions other than malignant neoplasm: Secondary | ICD-10-CM | POA: Diagnosis not present

## 2013-09-21 DIAGNOSIS — Z79899 Other long term (current) drug therapy: Secondary | ICD-10-CM | POA: Diagnosis not present

## 2013-09-21 DIAGNOSIS — R7309 Other abnormal glucose: Secondary | ICD-10-CM | POA: Diagnosis not present

## 2013-09-21 DIAGNOSIS — E7800 Pure hypercholesterolemia, unspecified: Secondary | ICD-10-CM | POA: Diagnosis not present

## 2013-09-21 DIAGNOSIS — J449 Chronic obstructive pulmonary disease, unspecified: Secondary | ICD-10-CM | POA: Diagnosis not present

## 2013-09-21 DIAGNOSIS — I10 Essential (primary) hypertension: Secondary | ICD-10-CM | POA: Diagnosis not present

## 2013-09-21 DIAGNOSIS — M542 Cervicalgia: Secondary | ICD-10-CM | POA: Diagnosis not present

## 2013-09-21 DIAGNOSIS — Z23 Encounter for immunization: Secondary | ICD-10-CM | POA: Diagnosis not present

## 2013-10-13 DIAGNOSIS — L570 Actinic keratosis: Secondary | ICD-10-CM | POA: Diagnosis not present

## 2013-10-13 DIAGNOSIS — L821 Other seborrheic keratosis: Secondary | ICD-10-CM | POA: Diagnosis not present

## 2013-10-13 DIAGNOSIS — C44319 Basal cell carcinoma of skin of other parts of face: Secondary | ICD-10-CM | POA: Diagnosis not present

## 2013-10-13 DIAGNOSIS — R233 Spontaneous ecchymoses: Secondary | ICD-10-CM | POA: Diagnosis not present

## 2013-10-13 DIAGNOSIS — L578 Other skin changes due to chronic exposure to nonionizing radiation: Secondary | ICD-10-CM | POA: Diagnosis not present

## 2013-10-13 DIAGNOSIS — L981 Factitial dermatitis: Secondary | ICD-10-CM | POA: Diagnosis not present

## 2013-10-31 DIAGNOSIS — C44319 Basal cell carcinoma of skin of other parts of face: Secondary | ICD-10-CM | POA: Diagnosis not present

## 2013-11-17 DIAGNOSIS — L570 Actinic keratosis: Secondary | ICD-10-CM | POA: Diagnosis not present

## 2013-11-17 DIAGNOSIS — C44319 Basal cell carcinoma of skin of other parts of face: Secondary | ICD-10-CM | POA: Diagnosis not present

## 2013-11-17 DIAGNOSIS — L821 Other seborrheic keratosis: Secondary | ICD-10-CM | POA: Diagnosis not present

## 2013-11-17 DIAGNOSIS — L578 Other skin changes due to chronic exposure to nonionizing radiation: Secondary | ICD-10-CM | POA: Diagnosis not present

## 2013-11-28 DIAGNOSIS — I714 Abdominal aortic aneurysm, without rupture, unspecified: Secondary | ICD-10-CM | POA: Diagnosis not present

## 2013-11-28 DIAGNOSIS — I719 Aortic aneurysm of unspecified site, without rupture: Secondary | ICD-10-CM | POA: Diagnosis not present

## 2013-11-28 DIAGNOSIS — I70219 Atherosclerosis of native arteries of extremities with intermittent claudication, unspecified extremity: Secondary | ICD-10-CM | POA: Diagnosis not present

## 2013-12-19 DIAGNOSIS — Z23 Encounter for immunization: Secondary | ICD-10-CM | POA: Diagnosis not present

## 2013-12-29 DIAGNOSIS — I10 Essential (primary) hypertension: Secondary | ICD-10-CM | POA: Diagnosis not present

## 2013-12-29 DIAGNOSIS — F411 Generalized anxiety disorder: Secondary | ICD-10-CM | POA: Diagnosis not present

## 2013-12-29 DIAGNOSIS — E780 Pure hypercholesterolemia: Secondary | ICD-10-CM | POA: Diagnosis not present

## 2013-12-29 DIAGNOSIS — R7301 Impaired fasting glucose: Secondary | ICD-10-CM | POA: Diagnosis not present

## 2013-12-29 DIAGNOSIS — J449 Chronic obstructive pulmonary disease, unspecified: Secondary | ICD-10-CM | POA: Diagnosis not present

## 2013-12-29 DIAGNOSIS — R5383 Other fatigue: Secondary | ICD-10-CM | POA: Diagnosis not present

## 2014-02-15 DIAGNOSIS — J439 Emphysema, unspecified: Secondary | ICD-10-CM | POA: Diagnosis not present

## 2014-02-15 DIAGNOSIS — R911 Solitary pulmonary nodule: Secondary | ICD-10-CM | POA: Diagnosis not present

## 2014-02-15 DIAGNOSIS — C3431 Malignant neoplasm of lower lobe, right bronchus or lung: Secondary | ICD-10-CM | POA: Diagnosis not present

## 2014-02-16 DIAGNOSIS — L570 Actinic keratosis: Secondary | ICD-10-CM | POA: Diagnosis not present

## 2014-02-16 DIAGNOSIS — C44319 Basal cell carcinoma of skin of other parts of face: Secondary | ICD-10-CM | POA: Diagnosis not present

## 2014-02-26 DIAGNOSIS — R911 Solitary pulmonary nodule: Secondary | ICD-10-CM | POA: Diagnosis not present

## 2014-02-26 DIAGNOSIS — C3491 Malignant neoplasm of unspecified part of right bronchus or lung: Secondary | ICD-10-CM | POA: Diagnosis not present

## 2014-02-26 DIAGNOSIS — C3431 Malignant neoplasm of lower lobe, right bronchus or lung: Secondary | ICD-10-CM | POA: Diagnosis not present

## 2014-02-26 DIAGNOSIS — J439 Emphysema, unspecified: Secondary | ICD-10-CM | POA: Diagnosis not present

## 2014-02-27 DIAGNOSIS — R918 Other nonspecific abnormal finding of lung field: Secondary | ICD-10-CM | POA: Diagnosis not present

## 2014-02-27 DIAGNOSIS — C3431 Malignant neoplasm of lower lobe, right bronchus or lung: Secondary | ICD-10-CM | POA: Diagnosis not present

## 2014-03-06 DIAGNOSIS — E785 Hyperlipidemia, unspecified: Secondary | ICD-10-CM | POA: Diagnosis not present

## 2014-03-06 DIAGNOSIS — C3490 Malignant neoplasm of unspecified part of unspecified bronchus or lung: Secondary | ICD-10-CM | POA: Diagnosis not present

## 2014-03-06 DIAGNOSIS — Z952 Presence of prosthetic heart valve: Secondary | ICD-10-CM | POA: Diagnosis not present

## 2014-03-06 DIAGNOSIS — I10 Essential (primary) hypertension: Secondary | ICD-10-CM | POA: Diagnosis not present

## 2014-03-07 DIAGNOSIS — R911 Solitary pulmonary nodule: Secondary | ICD-10-CM | POA: Diagnosis not present

## 2014-03-07 DIAGNOSIS — C3431 Malignant neoplasm of lower lobe, right bronchus or lung: Secondary | ICD-10-CM | POA: Diagnosis not present

## 2014-03-13 DIAGNOSIS — C3431 Malignant neoplasm of lower lobe, right bronchus or lung: Secondary | ICD-10-CM | POA: Diagnosis not present

## 2014-03-13 DIAGNOSIS — C3411 Malignant neoplasm of upper lobe, right bronchus or lung: Secondary | ICD-10-CM | POA: Diagnosis not present

## 2014-03-13 DIAGNOSIS — R911 Solitary pulmonary nodule: Secondary | ICD-10-CM | POA: Diagnosis not present

## 2014-03-13 DIAGNOSIS — R918 Other nonspecific abnormal finding of lung field: Secondary | ICD-10-CM | POA: Diagnosis not present

## 2014-03-19 DIAGNOSIS — C3431 Malignant neoplasm of lower lobe, right bronchus or lung: Secondary | ICD-10-CM | POA: Diagnosis not present

## 2014-03-26 NOTE — Progress Notes (Signed)
Thoracic Location of Tumor / Histology: recurrent squamous cell lung cancer, right upper lobe lung mass  Patient presented with a recurrence on his CT scan from 02/26/2014.  Biopsies revealed:   03/13/14   Tobacco/Marijuana/Snuff/ETOH use: quit smoking in 2002, smoked 2 ppd for 45 years, currently uses chew daily, denies marijuana, ETOH use  Past/Anticipated interventions by cardiothoracic surgery, if any: previous partial right pneumonectomy, 03/13/14 - CT guided biopsy of posterior right upper lobe lung nodule  Past/Anticipated interventions by medical oncology, if any: concurrent chemoradiation in 2011.  Signs/Symptoms  Weight changes, if any: no  Respiratory complaints, if any: yes occasional cough, shortness of breath with activity  Hemoptysis, if any: no  Pain issues, if any:  No - reports he does not feel good.  SAFETY ISSUES:  Prior radiation? Yes - 07/01/2009-08/16/2009 - 6800 Gy to right hilum lung lesion  Pacemaker/ICD? no   Possible current pregnancy?no  Is the patient on methotrexate? 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  Patient is here with his wife.  He has one child.

## 2014-03-27 ENCOUNTER — Encounter: Payer: Self-pay | Admitting: Radiation Oncology

## 2014-03-28 ENCOUNTER — Encounter: Payer: Self-pay | Admitting: Radiation Oncology

## 2014-03-28 ENCOUNTER — Ambulatory Visit
Admission: RE | Admit: 2014-03-28 | Discharge: 2014-03-28 | Disposition: A | Discharge status: Home / Self Care | Payer: Medicare Other | Source: Ambulatory Visit | Attending: Radiation Oncology | Admitting: Radiation Oncology

## 2014-03-28 VITALS — BP 106/64 | HR 87 | Temp 98.1°F | Resp 20 | Ht 68.0 in | Wt 175.7 lb

## 2014-03-28 DIAGNOSIS — Z85118 Personal history of other malignant neoplasm of bronchus and lung: Secondary | ICD-10-CM | POA: Insufficient documentation

## 2014-03-28 DIAGNOSIS — I10 Essential (primary) hypertension: Secondary | ICD-10-CM | POA: Diagnosis not present

## 2014-03-28 DIAGNOSIS — Z902 Acquired absence of lung [part of]: Secondary | ICD-10-CM | POA: Diagnosis not present

## 2014-03-28 DIAGNOSIS — Z7982 Long term (current) use of aspirin: Secondary | ICD-10-CM | POA: Diagnosis not present

## 2014-03-28 DIAGNOSIS — C3411 Malignant neoplasm of upper lobe, right bronchus or lung: Secondary | ICD-10-CM | POA: Insufficient documentation

## 2014-03-28 DIAGNOSIS — Z7951 Long term (current) use of inhaled steroids: Secondary | ICD-10-CM | POA: Diagnosis not present

## 2014-03-28 DIAGNOSIS — Z510 Encounter for antineoplastic radiation therapy: Secondary | ICD-10-CM | POA: Diagnosis not present

## 2014-03-28 DIAGNOSIS — Z87891 Personal history of nicotine dependence: Secondary | ICD-10-CM | POA: Insufficient documentation

## 2014-03-28 DIAGNOSIS — J439 Emphysema, unspecified: Secondary | ICD-10-CM | POA: Diagnosis not present

## 2014-03-28 DIAGNOSIS — Z8579 Personal history of other malignant neoplasms of lymphoid, hematopoietic and related tissues: Secondary | ICD-10-CM | POA: Diagnosis not present

## 2014-03-28 DIAGNOSIS — Z923 Personal history of irradiation: Secondary | ICD-10-CM | POA: Diagnosis not present

## 2014-03-28 DIAGNOSIS — Z8589 Personal history of malignant neoplasm of other organs and systems: Secondary | ICD-10-CM | POA: Diagnosis not present

## 2014-03-28 DIAGNOSIS — C3491 Malignant neoplasm of unspecified part of right bronchus or lung: Secondary | ICD-10-CM | POA: Insufficient documentation

## 2014-03-28 HISTORY — DX: Malignant neoplasm of unspecified part of unspecified bronchus or lung: C34.90

## 2014-03-28 HISTORY — DX: Aneurysm of unspecified site: I72.9

## 2014-03-28 NOTE — Progress Notes (Signed)
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 male who is seen out courtesy of Dr. [name] for an opinion concerning radiation therapy as part of management of patient's recurrent non-small cell lung cancer. Patient has a prior history of right lower lung cancer undergoing wedge resection [facility] several years ago. He recurred in the left supraclavicular region at a later date. Patient had surgery for this problem but no postoperative radiation therapy. In 2011 the patient recurred again in the right hilar area. He proceeded to undergo a definitive course of chemoradiation.   the patient received radiation therapy from 07/01/2009 through 08/16/2009. The patient completed 68 gray in 34 fractions using 3-D conformal techniques. This treatment was under the direction of Dr Dorisann Frames. Patient has done well since that time however more recently on routine imaging studies the patient was noted to have a recurrence in the right upper lung area. PET scan confirmed a solitary recurrence in this area and biopsy revealed moderately differentiated, keratinizing squamous cell carcinoma. Patient is not felt to be a candidate for surgey this recurrence in light of his prior history of heart disease as well as previous radiation therapy and overall performance status. Patient is now seen in radiation oncology for consideration for SBRT for management of his solitary recurrence.  PREVIOUS RADIATION THERAPY: Yes as documented above  PAST MEDICAL HISTORY:  has a past medical history of Hypertension; Emphysema; Cancer of neck; Lymph  node cancer; Squamous cell lung cancer; and Aneurysm.    PAST SURGICAL HISTORY: Past Surgical History  Procedure Laterality Date  . Partial rll resection      [facility] March 09'  . Cardiac valve surgery    . Tonsilectomy/adenoidectomy with myringotomy    . Tumor removal      left neck 2007    FAMILY HISTORY: family history includes Emphysema in his brother.  SOCIAL HISTORY:  reports that he quit smoking about 14 years ago. His smoking use included Cigarettes. He has a 90 pack-year smoking history. His smokeless tobacco use includes Chew. He reports that he does not drink alcohol or use illicit drugs.  ALLERGIES: Review of patient's allergies indicates no known allergies.  MEDICATIONS:  Current Outpatient Prescriptions  Medication Sig Dispense Refill  . albuterol (PROAIR HFA) 108 (90 BASE) MCG/ACT inhaler Inhale 2 puffs into the lungs every 6 (six) hours as needed for wheezing.    Marland Kitchen aspirin 325 MG tablet Take 325 mg by mouth daily.    . budesonide (PULMICORT) 0.5 MG/2ML nebulizer solution Take 0.5 mg by nebulization 2 (two) times daily.    . clonazePAM (KLONOPIN) 1 MG tablet Take 1 mg by mouth 2 (two) times daily as needed for anxiety.    . digoxin (LANOXIN) 0.125 MG tablet Take 0.125 mg by mouth daily.    Marland Kitchen ezetimibe (ZETIA) 10 MG tablet Take 10 mg by mouth daily.    . formoterol (PERFOROMIST) 20 MCG/2ML nebulizer solution Take 20 mcg by nebulization 2 (two) times daily.    . formoterol (PERFOROMIST) 20 MCG/2ML nebulizer solution Take 20 mcg by nebulization 2 (two) times daily.    Marland Kitchen losartan-hydrochlorothiazide (HYZAAR)  50-12.5 MG per tablet Take 1 tablet by mouth daily as needed.    . metoprolol tartrate (LOPRESSOR) 25 MG tablet 1/2 tablet twice per day    . vitamin E 400 UNIT capsule Take 400 Units by mouth daily.    Marland Kitchen albuterol (PROVENTIL) (2.5 MG/3ML) 0.083% nebulizer solution Take 2.5 mg by nebulization every 6 (six) hours as needed for wheezing or shortness of breath.    .  nitroGLYCERIN (NITROSTAT) 0.4 MG SL tablet Place 0.4 mg under the tongue every 5 (five) minutes as needed for chest pain.     No current facility-administered medications for this encounter.    REVIEW OF SYSTEMS:  A 15 point review of systems is documented in the electronic medical record. This was obtained by the nursing staff. However, I reviewed this with the patient to discuss relevant findings and make appropriate changes. Patient has dyspnea on exertion. He does use supplemental oxygen at night and occasionally during the daytime. He denies any pain in the chest area or hemoptysis. Patient denies any new bony pain headaches dizziness or blurred vision. Somewhat hard of hearing, does not use his hearing aids   PHYSICAL EXAM:  height is 5\' 8"  (1.727 m) and weight is 175 lb 11.2 oz (79.697 kg). His oral temperature is 98.1 F (36.7 C). His blood pressure is 106/64 and his pulse is 87. His respiration is 20 and oxygen saturation is 94%.   BP 106/64 mmHg  Pulse 87  Temp(Src) 98.1 F (36.7 C) (Oral)  Resp 20  Ht 5\' 8"  (1.727 m)  Wt 175 lb 11.2 oz (79.697 kg)  BMI 26.72 kg/m2  SpO2 94%  General Appearance:    Alert, cooperative, no distress, appears stated age, accompanied by his wife on evaluation today   Head:    Normocephalic, without obvious abnormality, atraumatic  Eyes:    PERRL, conjunctiva/corneas clear, EOM's intact,         Ears:    Normal TM's and external ear canals, both ears  Nose:   Nares normal, septum midline, mucosa normal, no drainage    or sinus tenderness  Throat:   Lips, mucosa, and tongue normal; dentures in place, gums normal  Neck:   Supple, symmetrical, trachea midline, no adenopathy;       thyroid:  No enlargement/tenderness/nodules; no carotid   bruit or JVD, scar along the left lower neck from his prior dissection as above   Back:     Symmetric, no curvature, ROM normal, no CVA tenderness  Lungs:     Clear to auscultation bilaterally, respirations unlabored    Chest wall:    No tenderness or deformity, small scars in the right lateral posterior chest from his prior wedge resection , 4 tattoos in place along the chest area from the patient's prior radiation treatment at [facility] , long scar along the sternum from prior heart valve surgery   Heart:    Regular rate and rhythm, S1 and S2 normal, no murmur, rub   or gallop  Abdomen:     Soft, non-tender, bowel sounds active all four quadrants,    no masses, no organomegaly        Extremities:   Extremities normal, atraumatic, no cyanosis or edema  Pulses:   2+ and symmetric all extremities  Skin:   Skin color, texture, turgor normal, no rashes or lesions  Lymph nodes:   Cervical, supraclavicular, and axillary nodes normal  Neurologic:    Normal strength, sensation and reflexes  throughout     ECOG = 1    1 - Symptomatic but completely ambulatory (Restricted in physically strenuous activity but ambulatory and able to carry out work of a light or sedentary nature. For example, light housework, office work)  LABORATORY DATA:  Lab Results  Component Value Date   WBC 8.1 06/04/2009   HGB 14.1 06/04/2009   HCT 41.7 06/04/2009   MCV 94.7 06/04/2009   PLT 131* 06/04/2009   Lab Results  Component Value Date   NA 140 06/04/2009   K 4.3 06/04/2009   CL 105 06/04/2009   CO2 32 06/04/2009   GLUCOSE 152* 06/04/2009   CREATININE 0.90 06/04/2009   CALCIUM 9.6 06/04/2009      RADIOGRAPHY: No results found.    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 The patient would be a candidate for a definitive course of radiation therapy for his oligo-metastasis in the right upper lung field. Would like to review the patient's prior radiation therapy directed the right hilar area (and this information has been requested) to assess for any potential overlap but anticipate his planned radiation field will be superior to his previous radiation therapy fields.  I discussed the treatment course side effects and potential toxicities of radiation therapy in this situation with the patient and his wife. The patient appears to understand and wishes to proceed with planned course of treatment.  PLAN: Simulation and planning on February 2 with treatments to begin soon afterwards. I anticipate between 3 and 5 SBRT treatments.   I spent 60 minutes minutes face to face with the patient and more than 50% of that time was spent in counseling and/or coordination of care.   ------------------------------------------------  Blair Promise, PhD, MD

## 2014-03-28 NOTE — Progress Notes (Signed)
Please see the Nurse Progress Note in the MD Initial Consult Encounter for this patient. 

## 2014-04-03 ENCOUNTER — Other Ambulatory Visit: Payer: Self-pay | Admitting: Radiation Oncology

## 2014-04-03 ENCOUNTER — Ambulatory Visit
Admission: RE | Admit: 2014-04-03 | Discharge: 2014-04-03 | Disposition: A | Discharge status: Home / Self Care | Payer: Medicare Other | Source: Ambulatory Visit | Attending: Radiation Oncology | Admitting: Radiation Oncology

## 2014-04-03 DIAGNOSIS — C3491 Malignant neoplasm of unspecified part of right bronchus or lung: Secondary | ICD-10-CM

## 2014-04-03 DIAGNOSIS — Z85118 Personal history of other malignant neoplasm of bronchus and lung: Secondary | ICD-10-CM | POA: Diagnosis not present

## 2014-04-03 DIAGNOSIS — Z902 Acquired absence of lung [part of]: Secondary | ICD-10-CM | POA: Diagnosis not present

## 2014-04-03 DIAGNOSIS — C3411 Malignant neoplasm of upper lobe, right bronchus or lung: Secondary | ICD-10-CM | POA: Diagnosis not present

## 2014-04-03 DIAGNOSIS — I10 Essential (primary) hypertension: Secondary | ICD-10-CM | POA: Diagnosis not present

## 2014-04-03 DIAGNOSIS — Z510 Encounter for antineoplastic radiation therapy: Secondary | ICD-10-CM | POA: Diagnosis not present

## 2014-04-03 DIAGNOSIS — J439 Emphysema, unspecified: Secondary | ICD-10-CM | POA: Diagnosis not present

## 2014-04-04 NOTE — Progress Notes (Signed)
  Radiation Oncology         [phone] ________________________________  Name: Tommy [name] MRN: [id number]  Date: 04/03/2014  DOB: [birth year]  SIMULATION AND TREATMENT PLANNING NOTE    ICD-9-CM ICD-10-CM   1. 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  Identity was confirmed.  All relevant records and images related to the planned course of therapy were reviewed.  The patient freely provided informed written consent to proceed with treatment after reviewing the details related to the planned course of therapy. The consent form was witnessed and verified by the simulation staff.  Then, the patient was set-up in a stable reproducible  supine position for radiation therapy.  CT images were obtained.  Surface markings were placed.  The CT images were loaded into the planning software.  Then the target and avoidance structures were contoured.  Treatment planning then occurred.  The radiation prescription was entered and confirmed.  Then, I designed and supervised the construction of a total of 2 medically necessary complex treatment devices.  I have requested : Intensity Modulated Radiotherapy (IMRT) is medically necessary for this case for the following reason:  Previous treatment to this area..  I have ordered:dose calc.   PLAN:  The patient will receive 54 Gy in 3 fractions using SBRT techniques.  ________________________________  -----------------------------------  Blair Promise, PhD, MD

## 2014-04-05 DIAGNOSIS — E780 Pure hypercholesterolemia: Secondary | ICD-10-CM | POA: Diagnosis not present

## 2014-04-05 DIAGNOSIS — R0789 Other chest pain: Secondary | ICD-10-CM | POA: Diagnosis not present

## 2014-04-05 DIAGNOSIS — I10 Essential (primary) hypertension: Secondary | ICD-10-CM | POA: Diagnosis not present

## 2014-04-13 DIAGNOSIS — Z85118 Personal history of other malignant neoplasm of bronchus and lung: Secondary | ICD-10-CM | POA: Diagnosis not present

## 2014-04-13 DIAGNOSIS — J439 Emphysema, unspecified: Secondary | ICD-10-CM | POA: Diagnosis not present

## 2014-04-13 DIAGNOSIS — I10 Essential (primary) hypertension: Secondary | ICD-10-CM | POA: Diagnosis not present

## 2014-04-13 DIAGNOSIS — C3411 Malignant neoplasm of upper lobe, right bronchus or lung: Secondary | ICD-10-CM | POA: Diagnosis not present

## 2014-04-13 DIAGNOSIS — Z510 Encounter for antineoplastic radiation therapy: Secondary | ICD-10-CM | POA: Diagnosis not present

## 2014-04-13 DIAGNOSIS — Z902 Acquired absence of lung [part of]: Secondary | ICD-10-CM | POA: Diagnosis not present

## 2014-04-16 DIAGNOSIS — C3491 Malignant neoplasm of unspecified part of right bronchus or lung: Secondary | ICD-10-CM | POA: Diagnosis not present

## 2014-04-17 ENCOUNTER — Encounter: Payer: Self-pay | Admitting: Radiation Oncology

## 2014-04-17 ENCOUNTER — Ambulatory Visit
Admission: RE | Admit: 2014-04-17 | Discharge: 2014-04-17 | Disposition: A | Discharge status: Home / Self Care | Payer: Medicare Other | Source: Ambulatory Visit | Attending: Radiation Oncology | Admitting: Radiation Oncology

## 2014-04-17 VITALS — BP 130/77 | HR 88 | Temp 98.1°F | Resp 16 | Ht 68.0 in | Wt 179.0 lb

## 2014-04-17 DIAGNOSIS — Z85118 Personal history of other malignant neoplasm of bronchus and lung: Secondary | ICD-10-CM | POA: Diagnosis not present

## 2014-04-17 DIAGNOSIS — C3491 Malignant neoplasm of unspecified part of right bronchus or lung: Secondary | ICD-10-CM | POA: Diagnosis not present

## 2014-04-17 DIAGNOSIS — Z902 Acquired absence of lung [part of]: Secondary | ICD-10-CM | POA: Diagnosis not present

## 2014-04-17 DIAGNOSIS — I10 Essential (primary) hypertension: Secondary | ICD-10-CM | POA: Diagnosis not present

## 2014-04-17 DIAGNOSIS — C3411 Malignant neoplasm of upper lobe, right bronchus or lung: Secondary | ICD-10-CM | POA: Diagnosis not present

## 2014-04-17 DIAGNOSIS — J439 Emphysema, unspecified: Secondary | ICD-10-CM | POA: Diagnosis not present

## 2014-04-17 DIAGNOSIS — Z510 Encounter for antineoplastic radiation therapy: Secondary | ICD-10-CM | POA: Diagnosis not present

## 2014-04-17 NOTE — Progress Notes (Signed)
  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 . . . . . . . The patient presents for routine under treatment assessment.                                   The patient is without complaint.                                 Set-up films were reviewed.                                 The chart was checked. Physical Findings. . .  height is 5\' 8"  (1.727 m) and weight is 179 lb (81.194 kg). His oral temperature is 98.1 F (36.7 C). His blood pressure is 130/77 and his pulse is 88. His respiration is 16 and oxygen saturation is 93%. . Weight essentially stable.  No significant changes. The lungs are clear. The heart has a regular rhythm and rate. Impression . . . . . . . The patient is tolerating radiation. Plan . . . . . . . . . . . . Continue treatment as planned.  ________________________________   Blair Promise, PhD, MD

## 2014-04-17 NOTE — Progress Notes (Signed)
  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 The patient was set up for stereotactic body radiotherapy on the body fix pillow.  3D TREATMENT PLANNING AND DOSIMETRY:  The patient's radiation plan was reviewed and approved prior to starting treatment.  It showed 3-dimensional radiation distributions overlaid onto the planning CT.  The [facility] for the target structures as well as the organs at risk were reviewed. The documentation of this is filed in the radiation oncology EMR.  SIMULATION VERIFICATION:  The patient underwent CT imaging on the treatment unit.  These were carefully aligned to document that the ablative radiation dose would cover the target volume and maximally spare the nearby organs at risk according to the planned distribution.  SPECIAL TREATMENT PROCEDURE: Tommy [name] received high dose ablative stereotactic body radiotherapy to the planned target volume without unforeseen complications. Treatment was delivered uneventfully. The high doses associated with stereotactic body radiotherapy and the significant potential risks require careful treatment set up and patient monitoring constituting a special treatment procedure   STEREOTACTIC TREATMENT MANAGEMENT:  Following delivery, the patient was evaluated clinically. The patient tolerated treatment without significant acute effects, and was discharged to home in stable condition.    PLAN: Continue treatment as planned.  ________________________________  Blair Promise, PhD, MD

## 2014-04-17 NOTE — Progress Notes (Signed)
Tommy [name] has completed 1 fraction to his right upper lobe.  He denies pain.  He reports an occasional cough with white sputum.  He reports using 3l of oxygen at night.  He reports shortness of breath with activity.  His oxygen saturation on room air today was 93%.  He has been given the Rad[facility].book to review.  He was advised to contact nursing with any questions or concerns.  BP 130/77 mmHg  Pulse 88  Temp(Src) 98.1 F (36.7 C) (Oral)  Resp 16  Ht 5\' 8"  (1.727 m)  Wt 179 lb (81.194 kg)  BMI 27.22 kg/m2  SpO2 93%

## 2014-04-18 ENCOUNTER — Ambulatory Visit: Payer: Medicare Other | Admitting: Radiation Oncology

## 2014-04-19 ENCOUNTER — Ambulatory Visit
Admission: RE | Admit: 2014-04-19 | Discharge: 2014-04-19 | Disposition: A | Discharge status: Home / Self Care | Payer: Medicare Other | Source: Ambulatory Visit | Attending: Radiation Oncology | Admitting: Radiation Oncology

## 2014-04-19 DIAGNOSIS — I10 Essential (primary) hypertension: Secondary | ICD-10-CM | POA: Diagnosis not present

## 2014-04-19 DIAGNOSIS — Z902 Acquired absence of lung [part of]: Secondary | ICD-10-CM | POA: Diagnosis not present

## 2014-04-19 DIAGNOSIS — C3411 Malignant neoplasm of upper lobe, right bronchus or lung: Secondary | ICD-10-CM | POA: Diagnosis not present

## 2014-04-19 DIAGNOSIS — Z85118 Personal history of other malignant neoplasm of bronchus and lung: Secondary | ICD-10-CM | POA: Diagnosis not present

## 2014-04-19 DIAGNOSIS — Z510 Encounter for antineoplastic radiation therapy: Secondary | ICD-10-CM | POA: Diagnosis not present

## 2014-04-19 DIAGNOSIS — J439 Emphysema, unspecified: Secondary | ICD-10-CM | POA: Diagnosis not present

## 2014-04-19 DIAGNOSIS — C3491 Malignant neoplasm of unspecified part of right bronchus or lung: Secondary | ICD-10-CM

## 2014-04-19 NOTE — Progress Notes (Signed)
  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 The patient was set up for stereotactic body radiotherapy on the body fix pillow.  3D TREATMENT PLANNING AND DOSIMETRY:  The patient's radiation plan was reviewed and approved prior to starting treatment.  It showed 3-dimensional radiation distributions overlaid onto the planning CT.  The [facility] for the target structures as well as the organs at risk were reviewed. The documentation of this is filed in the radiation oncology EMR.  SIMULATION VERIFICATION:  The patient underwent CT imaging on the treatment unit.  These were carefully aligned to document that the ablative radiation dose would cover the target volume and maximally spare the nearby organs at risk according to the planned distribution.  SPECIAL TREATMENT PROCEDURE: Tommy [name] received high dose ablative stereotactic body radiotherapy to the planned target volume without unforeseen complications. Treatment was delivered uneventfully. The high doses associated with stereotactic body radiotherapy and the significant potential risks require careful treatment set up and patient monitoring constituting a special treatment procedure   STEREOTACTIC TREATMENT MANAGEMENT:  Following delivery, the patient was evaluated clinically. The patient tolerated treatment without significant acute effects, and was discharged to home in stable condition.    PLAN: Continue treatment as planned.  ________________________________  Blair Promise, PhD, MD

## 2014-04-23 ENCOUNTER — Ambulatory Visit
Admission: RE | Admit: 2014-04-23 | Discharge: 2014-04-23 | Disposition: A | Discharge status: Home / Self Care | Payer: Medicare Other | Source: Ambulatory Visit | Attending: Radiation Oncology | Admitting: Radiation Oncology

## 2014-04-23 DIAGNOSIS — C3411 Malignant neoplasm of upper lobe, right bronchus or lung: Secondary | ICD-10-CM | POA: Diagnosis not present

## 2014-04-23 DIAGNOSIS — Z510 Encounter for antineoplastic radiation therapy: Secondary | ICD-10-CM | POA: Diagnosis not present

## 2014-04-23 DIAGNOSIS — J439 Emphysema, unspecified: Secondary | ICD-10-CM | POA: Diagnosis not present

## 2014-04-23 DIAGNOSIS — Z902 Acquired absence of lung [part of]: Secondary | ICD-10-CM | POA: Diagnosis not present

## 2014-04-23 DIAGNOSIS — Z85118 Personal history of other malignant neoplasm of bronchus and lung: Secondary | ICD-10-CM | POA: Diagnosis not present

## 2014-04-23 DIAGNOSIS — C3491 Malignant neoplasm of unspecified part of right bronchus or lung: Secondary | ICD-10-CM

## 2014-04-23 DIAGNOSIS — I10 Essential (primary) hypertension: Secondary | ICD-10-CM | POA: Diagnosis not present

## 2014-04-23 NOTE — Progress Notes (Signed)
   Radiation Oncology         [phone]) [phone] ________________________________  Name: Tommy [name] MRN: [id number]  Date: 04/23/2014  DOB: [birth year]  Stereotactic Body Radiotherapy Treatment Procedure Note   NARRATIVE: AUTHOR HATLESTAD was brought to the stereotactic radiation treatment machine and placed supine on the CT couch. The patient was set up for stereotactic body radiotherapy on the body fix device.   3D TREATMENT PLANNING AND DOSIMETRY: The patient's radiation plan was reviewed and approved prior to starting treatment. It showed 3-dimensional radiation distributions overlaid onto the planning CT. The [facility] for the target structures as well as the organs at risk were reviewed. The documentation of this is filed in the radiation oncology EMR.   SIMULATION VERIFICATION: The patient underwent CT imaging on the treatment unit. These were carefully aligned to document that the ablative radiation dose would cover the target volume and maximally spare the nearby organs at risk according to the planned distribution.   SPECIAL TREATMENT PROCEDURE: Tommy [name] received high dose ablative stereotactic body radiotherapy to the planned target volume without unforeseen complications. Treatment was delivered uneventfully. The high doses associated with stereotactic body radiotherapy and the significant potential risks require careful treatment set up and patient monitoring constituting a special treatment procedure.   STEREOTACTIC TREATMENT MANAGEMENT: Following delivery, the patient was evaluated clinically. The patient tolerated treatment without significant acute effects, and was discharged to home in stable condition.   Fraction: 3  Dose:  33 Gy  PLAN: Continue treatment as planned.   ________________________________  Jodelle Gross, MD, PhD

## 2014-04-24 ENCOUNTER — Ambulatory Visit: Payer: Medicare Other | Admitting: Radiation Oncology

## 2014-04-25 ENCOUNTER — Ambulatory Visit
Admission: RE | Admit: 2014-04-25 | Discharge: 2014-04-25 | Disposition: A | Discharge status: Home / Self Care | Payer: Medicare Other | Source: Ambulatory Visit | Attending: Radiation Oncology | Admitting: Radiation Oncology

## 2014-04-25 DIAGNOSIS — Z510 Encounter for antineoplastic radiation therapy: Secondary | ICD-10-CM | POA: Diagnosis not present

## 2014-04-25 DIAGNOSIS — J439 Emphysema, unspecified: Secondary | ICD-10-CM | POA: Diagnosis not present

## 2014-04-25 DIAGNOSIS — Z85118 Personal history of other malignant neoplasm of bronchus and lung: Secondary | ICD-10-CM | POA: Diagnosis not present

## 2014-04-25 DIAGNOSIS — Z902 Acquired absence of lung [part of]: Secondary | ICD-10-CM | POA: Diagnosis not present

## 2014-04-25 DIAGNOSIS — C3491 Malignant neoplasm of unspecified part of right bronchus or lung: Secondary | ICD-10-CM

## 2014-04-25 DIAGNOSIS — I10 Essential (primary) hypertension: Secondary | ICD-10-CM | POA: Diagnosis not present

## 2014-04-25 DIAGNOSIS — C3411 Malignant neoplasm of upper lobe, right bronchus or lung: Secondary | ICD-10-CM | POA: Diagnosis not present

## 2014-04-25 NOTE — Progress Notes (Signed)
  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 The patient was set up for stereotactic body radiotherapy on the body fix pillow.  3D TREATMENT PLANNING AND DOSIMETRY:  The patient's radiation plan was reviewed and approved prior to starting treatment.  It showed 3-dimensional radiation distributions overlaid onto the planning CT.  The [facility] for the target structures as well as the organs at risk were reviewed. The documentation of this is filed in the radiation oncology EMR.  SIMULATION VERIFICATION:  The patient underwent CT imaging on the treatment unit.  These were carefully aligned to document that the ablative radiation dose would cover the target volume and maximally spare the nearby organs at risk according to the planned distribution.  SPECIAL TREATMENT PROCEDURE: Jacinto Halim Imburgia received high dose ablative stereotactic body radiotherapy to the planned target volume without unforeseen complications. Treatment was delivered uneventfully. The high doses associated with stereotactic body radiotherapy and the significant potential risks require careful treatment set up and patient monitoring constituting a special treatment procedure   STEREOTACTIC TREATMENT MANAGEMENT:  Following delivery, the patient was evaluated clinically. The patient tolerated treatment without significant acute effects, and was discharged to home in stable condition.    PLAN: Continue treatment as planned.  ________________________________  Blair Promise, PhD, MD

## 2014-04-27 ENCOUNTER — Ambulatory Visit
Admission: RE | Admit: 2014-04-27 | Discharge: 2014-04-27 | Disposition: A | Discharge status: Home / Self Care | Payer: Medicare Other | Source: Ambulatory Visit | Attending: Radiation Oncology | Admitting: Radiation Oncology

## 2014-04-27 DIAGNOSIS — Z902 Acquired absence of lung [part of]: Secondary | ICD-10-CM | POA: Diagnosis not present

## 2014-04-27 DIAGNOSIS — I10 Essential (primary) hypertension: Secondary | ICD-10-CM | POA: Diagnosis not present

## 2014-04-27 DIAGNOSIS — C3411 Malignant neoplasm of upper lobe, right bronchus or lung: Secondary | ICD-10-CM | POA: Diagnosis not present

## 2014-04-27 DIAGNOSIS — J439 Emphysema, unspecified: Secondary | ICD-10-CM | POA: Diagnosis not present

## 2014-04-27 DIAGNOSIS — C3491 Malignant neoplasm of unspecified part of right bronchus or lung: Secondary | ICD-10-CM

## 2014-04-27 DIAGNOSIS — Z510 Encounter for antineoplastic radiation therapy: Secondary | ICD-10-CM | POA: Diagnosis not present

## 2014-04-27 DIAGNOSIS — Z85118 Personal history of other malignant neoplasm of bronchus and lung: Secondary | ICD-10-CM | POA: Diagnosis not present

## 2014-04-28 NOTE — Progress Notes (Signed)
   Radiation Oncology         (336) [phone] ________________________________  Name: Tommy [name] MRN: [id number]  Date: 04/27/2014  DOB: March 18, 1940  Stereotactic Body Radiotherapy Treatment Procedure Note   NARRATIVE: Tommy [name] was brought to the stereotactic radiation treatment machine and placed supine on the CT couch. The patient was set up for stereotactic body radiotherapy on the body fix device.   3D TREATMENT PLANNING AND DOSIMETRY: The patient's radiation plan was reviewed and approved prior to starting treatment. It showed 3-dimensional radiation distributions overlaid onto the planning CT. The [facility] for the target structures as well as the organs at risk were reviewed. The documentation of this is filed in the radiation oncology EMR.   SIMULATION VERIFICATION: The patient underwent CT imaging on the treatment unit. These were carefully aligned to document that the ablative radiation dose would cover the target volume and maximally spare the nearby organs at risk according to the planned distribution.   SPECIAL TREATMENT PROCEDURE: Tommy [name] received high dose ablative stereotactic body radiotherapy to the planned target volume without unforeseen complications. Treatment was delivered uneventfully. The high doses associated with stereotactic body radiotherapy and the significant potential risks require careful treatment set up and patient monitoring constituting a special treatment procedure.   STEREOTACTIC TREATMENT MANAGEMENT: Following delivery, the patient was evaluated clinically. The patient tolerated treatment without significant acute effects, and was discharged to home in stable condition.   Fraction: 5  Dose:  55 Gy  PLAN: The patient will follow-up in 1 month.   ________________________________  Jodelle Gross, MD, PhD

## 2014-05-01 DIAGNOSIS — I2510 Atherosclerotic heart disease of native coronary artery without angina pectoris: Secondary | ICD-10-CM | POA: Diagnosis not present

## 2014-05-07 ENCOUNTER — Encounter: Payer: Self-pay | Admitting: Radiation Oncology

## 2014-05-07 NOTE — Progress Notes (Signed)
  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  He had minimal fatigue in the course of his treatment  Plan: The patient has completed radiation treatment. The patient will return to radiation oncology clinic for routine followup in one month. I advised them to call or return sooner if they have any questions or concerns related to their recovery or treatment.  -----------------------------------  Blair Promise, PhD, MD

## 2014-05-30 ENCOUNTER — Encounter: Payer: Self-pay | Admitting: Radiation Oncology

## 2014-05-30 ENCOUNTER — Ambulatory Visit
Admission: RE | Admit: 2014-05-30 | Discharge: 2014-05-30 | Disposition: A | Discharge status: Home / Self Care | Payer: Medicare Other | Source: Ambulatory Visit | Attending: Radiation Oncology | Admitting: Radiation Oncology

## 2014-05-30 VITALS — BP 142/83 | HR 78 | Temp 97.5°F | Resp 20 | Ht 68.0 in | Wt 181.8 lb

## 2014-05-30 DIAGNOSIS — C3491 Malignant neoplasm of unspecified part of right bronchus or lung: Secondary | ICD-10-CM

## 2014-05-30 NOTE — Progress Notes (Signed)
Tommy [name] here for follow up after treatment to his right upper lobe.  He reports pain in his lower back that started a few days ago.  He reports it is better now.  He reports his breathing is better.  He reports coughing very little with white sputum.  He denies hemoptysis.  He denies trouble swallowing and a sore throat.  He reports fatigue.  BP 142/83 mmHg  Pulse 78  Temp(Src) 97.5 F (36.4 C) (Oral)  Resp 20  Ht 5\' 8"  (1.727 m)  Wt 181 lb 12.8 oz (82.464 kg)  BMI 27.65 kg/m2  SpO2 94%

## 2014-05-30 NOTE — Progress Notes (Signed)
Radiation Oncology         (336) [phone] ________________________________  Name: Tommy [name] MRN: [id number]  Date: 05/30/2014  DOB: 04/11/1940  Follow-Up Visit Note  CC: [name], MD  [name], MD    ICD-9-CM ICD-10-CM   1. 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  He is doing well this time. He denies any significant fatigue. He feels his breathing has improved over the past month. Patient denies any significant cough or hemoptysis.                           ALLERGIES:  has No Known Allergies.  Meds: Current Outpatient Prescriptions  Medication Sig Dispense Refill  . albuterol (PROAIR HFA) 108 (90 BASE) MCG/ACT inhaler Inhale 2 puffs into the lungs every 6 (six) hours as needed for wheezing.    Marland Kitchen albuterol (PROVENTIL) (2.5 MG/3ML) 0.083% nebulizer solution Take 2.5 mg by nebulization every 6 (six) hours as needed for wheezing or shortness of breath.    Marland Kitchen aspirin 325 MG tablet Take 325 mg by mouth daily.    . budesonide (PULMICORT) 0.5 MG/2ML nebulizer solution Take 0.5 mg by nebulization 2 (two) times daily.    . clonazePAM (KLONOPIN) 1 MG tablet Take 1 mg by mouth 2 (two) times daily as needed for anxiety.    . digoxin (LANOXIN) 0.125 MG tablet Take 0.125 mg by mouth daily.    Marland Kitchen ezetimibe (ZETIA) 10 MG tablet Take 10 mg by mouth daily.    . formoterol (PERFOROMIST) 20 MCG/2ML nebulizer solution Take 20 mcg by nebulization 2 (two) times daily.    . formoterol (PERFOROMIST) 20 MCG/2ML nebulizer solution Take 20 mcg by nebulization 2 (two) times daily.    Marland Kitchen losartan-hydrochlorothiazide (HYZAAR) 50-12.5 MG per tablet Take 1 tablet by mouth daily as needed.    . metoprolol tartrate (LOPRESSOR) 25 MG tablet 1/2 tablet twice per day    .  nitroGLYCERIN (NITROSTAT) 0.4 MG SL tablet Place 0.4 mg under the tongue every 5 (five) minutes as needed for chest pain.    . Omega-3 Fatty Acids (FISH OIL) 1000 MG CAPS Take 2 capsules by mouth daily.    . vitamin E 400 UNIT capsule Take 400 Units by mouth daily.     No current facility-administered medications for this encounter.    Physical Findings: The patient is in no acute distress. Patient is alert and oriented.  height is 5\' 8"  (1.727 m) and weight is 181 lb 12.8 oz (82.464 kg). His oral temperature is 97.5 F (36.4 C). His blood pressure is 142/83 and his pulse is 78. His respiration is 20 and oxygen saturation is 94%. .  No palpable supraclavicular or axillary adenopathy. The lungs are clear. The heart has a regular rhythm and rate  Lab Findings: Lab Results  Component Value Date   WBC 8.1 06/04/2009   HGB 14.1 06/04/2009   HCT 41.7 06/04/2009   MCV 94.7 06/04/2009   PLT 131* 06/04/2009    Radiographic Findings: No results found.  Impression:  The patient is recovering from the effects of radiation.  No evidence of recurrence on clinical exam today  Plan:  Routine follow-up in 3 months. At that time the patient will be  scheduled for his first chest CT scan status post SBRT  ____________________________________ Blair Promise, MD

## 2014-06-18 DIAGNOSIS — L719 Rosacea, unspecified: Secondary | ICD-10-CM | POA: Diagnosis not present

## 2014-06-18 DIAGNOSIS — L570 Actinic keratosis: Secondary | ICD-10-CM | POA: Diagnosis not present

## 2014-07-05 DIAGNOSIS — J441 Chronic obstructive pulmonary disease with (acute) exacerbation: Secondary | ICD-10-CM | POA: Diagnosis not present

## 2014-07-16 DIAGNOSIS — J441 Chronic obstructive pulmonary disease with (acute) exacerbation: Secondary | ICD-10-CM | POA: Diagnosis not present

## 2014-08-20 ENCOUNTER — Ambulatory Visit: Payer: Medicare Other | Admitting: Radiation Oncology

## 2014-08-27 DIAGNOSIS — E780 Pure hypercholesterolemia: Secondary | ICD-10-CM | POA: Diagnosis not present

## 2014-08-27 DIAGNOSIS — R300 Dysuria: Secondary | ICD-10-CM | POA: Diagnosis not present

## 2014-08-27 DIAGNOSIS — R7301 Impaired fasting glucose: Secondary | ICD-10-CM | POA: Diagnosis not present

## 2014-08-27 DIAGNOSIS — I10 Essential (primary) hypertension: Secondary | ICD-10-CM | POA: Diagnosis not present

## 2014-09-05 ENCOUNTER — Encounter: Payer: Self-pay | Admitting: Oncology

## 2014-09-06 ENCOUNTER — Ambulatory Visit
Admission: RE | Admit: 2014-09-06 | Discharge: 2014-09-06 | Disposition: A | Discharge status: Home / Self Care | Payer: Medicare Other | Source: Ambulatory Visit | Attending: Radiation Oncology | Admitting: Radiation Oncology

## 2014-09-06 ENCOUNTER — Encounter: Payer: Self-pay | Admitting: Radiation Oncology

## 2014-09-06 VITALS — BP 119/82 | HR 101 | Temp 98.2°F | Resp 20 | Ht 68.0 in | Wt 174.5 lb

## 2014-09-06 DIAGNOSIS — C3491 Malignant neoplasm of unspecified part of right bronchus or lung: Secondary | ICD-10-CM

## 2014-09-06 NOTE — Progress Notes (Signed)
Tommy [name] here for follow up.  He denies pain.  He reports using 2-3 L of oxygen at night. He reports after radiation he was using the oxygen all the time.  He is mostly using it at night now.  He reports his shortness of breath is worse when his blood pressure is high.  He reports he is coughing very little with white sputum.  He denies hemoptysis.  He reports a good appetite although he has lost 7 lbs since March.  He reports he gets tired easily.  BP 119/82 mmHg  Pulse 101  Temp(Src) 98.2 F (36.8 C) (Oral)  Resp 20  Ht '5\' 8"'$  (1.727 m)  Wt 174 lb 8 oz (79.153 kg)  BMI 26.54 kg/m2  SpO2 91%   Wt Readings from Last 3 Encounters:  09/06/14 174 lb 8 oz (79.153 kg)  05/30/14 181 lb 12.8 oz (82.464 kg)  04/17/14 179 lb (81.194 kg)

## 2014-09-06 NOTE — Progress Notes (Signed)
Radiation Oncology         [phone] ________________________________  Name: Tommy [name] MRN: [id number]  Date: 09/06/2014  DOB: 1940/05/04  Follow-Up Visit Note  CC: [name], MD  [name], MD  No diagnosis found.  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  He is doing well this time. He denies pain. He reports using 2-3 L of oxygen at night. He reports after radiation he was using the oxygen all the time. He is mostly using it at night now. He reports his shortness of breath is worse when his blood pressure is high. He reports he is coughing very little with white sputum. He denies hemoptysis. He reports a good appetite although he has lost 7 lbs since March. He reports he gets tired easily. His breathing becomes difficult if he exerts himself to much. He has not had a chest x-ray or a CT scan done. He has seen Dr. [name] since his last appointment with radiation oncology. His wife mentions the cancer center in [place] with Dr. [name] had called to schedule a chest x ray, deciding to call back after his appointment here.                           ALLERGIES:  has No Known Allergies.  Meds: Current Outpatient Prescriptions  Medication Sig Dispense Refill  . albuterol (PROAIR HFA) 108 (90 BASE) MCG/ACT inhaler Inhale 2 puffs into the lungs every 6 (six) hours as needed for wheezing.    Marland Kitchen albuterol (PROVENTIL) (2.5 MG/3ML) 0.083% nebulizer solution Take 2.5 mg by nebulization every 6 (six) hours as needed for wheezing or shortness of breath.    Marland Kitchen aspirin 325 MG tablet Take 325 mg by mouth daily.    . budesonide (PULMICORT) 0.5 MG/2ML nebulizer solution Take 0.5 mg by nebulization 2 (two) times daily.    . clonazePAM (KLONOPIN) 1 MG tablet Take 1 mg by mouth 2 (two) times daily as needed for anxiety.    . digoxin  (LANOXIN) 0.125 MG tablet Take 0.125 mg by mouth daily.    Marland Kitchen ezetimibe (ZETIA) 10 MG tablet Take 10 mg by mouth daily.    . formoterol (PERFOROMIST) 20 MCG/2ML nebulizer solution Take 20 mcg by nebulization 2 (two) times daily.    . formoterol (PERFOROMIST) 20 MCG/2ML nebulizer solution Take 20 mcg by nebulization 2 (two) times daily.    Marland Kitchen losartan-hydrochlorothiazide (HYZAAR) 50-12.5 MG per tablet Take 1 tablet by mouth daily as needed.    . metoprolol tartrate (LOPRESSOR) 25 MG tablet 1/2 tablet twice per day    . nitroGLYCERIN (NITROSTAT) 0.4 MG SL tablet Place 0.4 mg under the tongue every 5 (five) minutes as needed for chest pain.    . Omega-3 Fatty Acids (FISH OIL) 1000 MG CAPS Take 2 capsules by mouth daily.    . vitamin E 400 UNIT capsule Take 400 Units by mouth daily.     No current facility-administered medications for this encounter.    Physical Findings: The patient is in no acute distress. Patient is alert and oriented.  vitals were not taken for this visit.Marland Kitchen  No palpable supraclavicular or axillary adenopathy. The lungs are clear but breath sounds are distant. The heart has a regular rhythm and rate.  Lab Findings: Lab Results  Component Value  Date   WBC 8.1 06/04/2009   HGB 14.1 06/04/2009   HCT 41.7 06/04/2009   MCV 94.7 06/04/2009   PLT 131* 06/04/2009    Radiographic Findings: No results found.  Impression:  The patient is recovering from the effects of radiation.  No evidence of recurrence on clinical exam today.  Plan:  Will schedule for a chest CT to be done in . Routine follow up scheduled in 3 months.   This document serves as a record of services personally performed by Gery Pray, MD. It was created on his behalf by Arlyce Harman, a trained medical scribe. The creation of this record is based on the scribe's personal observations and the provider's statements to them. This document has been checked and approved by the attending  provider. -----------------------------------  Blair Promise, PhD, MD

## 2014-09-07 ENCOUNTER — Telehealth: Payer: Self-pay | Admitting: *Deleted

## 2014-09-07 NOTE — Telephone Encounter (Signed)
CALLED PATIENT TO INFORM OF LAB, CT FOR 09-12-14 - ARRIVAL TIME - 12:30 PM FOR REGISTRATION AND LABS, THEN CT @ 1:30 PM @ [facility], AND HIS FU WITH DR. KINARD ON 12-13-14 @ 4 PM, SPOKE WITH PATIENT AND HE IS AWARE OF THESE APPTS.

## 2014-09-12 DIAGNOSIS — I7100 Dissection of unspecified site of aorta: Secondary | ICD-10-CM | POA: Diagnosis not present

## 2014-09-12 DIAGNOSIS — J439 Emphysema, unspecified: Secondary | ICD-10-CM | POA: Diagnosis not present

## 2014-09-12 DIAGNOSIS — I7101 Dissection of thoracic aorta: Secondary | ICD-10-CM | POA: Diagnosis not present

## 2014-09-12 DIAGNOSIS — R911 Solitary pulmonary nodule: Secondary | ICD-10-CM | POA: Diagnosis not present

## 2014-09-12 DIAGNOSIS — K449 Diaphragmatic hernia without obstruction or gangrene: Secondary | ICD-10-CM | POA: Diagnosis not present

## 2014-09-12 DIAGNOSIS — C3490 Malignant neoplasm of unspecified part of unspecified bronchus or lung: Secondary | ICD-10-CM | POA: Diagnosis not present

## 2014-09-12 DIAGNOSIS — K861 Other chronic pancreatitis: Secondary | ICD-10-CM | POA: Diagnosis not present

## 2014-09-12 DIAGNOSIS — J8410 Pulmonary fibrosis, unspecified: Secondary | ICD-10-CM | POA: Diagnosis not present

## 2014-10-04 ENCOUNTER — Ambulatory Visit (INDEPENDENT_AMBULATORY_CARE_PROVIDER_SITE_OTHER): Payer: Medicare Other | Admitting: Cardiothoracic Surgery

## 2014-10-04 ENCOUNTER — Encounter: Payer: Self-pay | Admitting: Cardiothoracic Surgery

## 2014-10-04 VITALS — BP 135/90 | HR 92 | Resp 20 | Ht 68.0 in | Wt 174.0 lb

## 2014-10-04 DIAGNOSIS — I7101 Dissection of ascending aorta: Secondary | ICD-10-CM

## 2014-10-04 DIAGNOSIS — I71019 Dissection of thoracic aorta, unspecified: Secondary | ICD-10-CM

## 2014-10-04 NOTE — Progress Notes (Signed)
[address]       [place] [address]             [phone]                    Darric L Matsumura [place] Medical Record #[id number] Date of Birth: 1940/03/20  Referring: Gery Pray, MD Primary Care: [name], MD  Chief Complaint:    Chief Complaint  Patient presents with  . Thoracic Aortic Dissection    Surgical eval, Chest CT 09/12/14, HX of  AVR 01/28/2009    History of Present Illness:    Tommy [name] 74 y.o. male is seen in the office  today for at the request of radiation oncology because of the finding of a descending aortic dissection on CT scan of the chest done in [facility] July 13. The patient has a complex previous medical history but includes an aortic valve replacement for critical aortic stenosis a 23 mm magna [place] aortic valve pericardial tissue valve was placed by me 01/28/2010. The patient is also had multiple cancers including wedge resection of the right lower lobe and [facility] in 2008. A radical left radical neck dissection for squamous cell carcinoma in 2011 he had recurrence of squamous cell carcinoma in a right hilar nodes. 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 Patient has very poor lung function, on home oxygen at night and with very limited pulmonary reserve.   The patient denies any episodes of chest pain, but now presents with a chronic ascending aortic dissection. There was no evidence of dissection in January 2016 and now is present. The patient is asymptomatic from.  He does have shortness of breath but denies chest pain .   Current Activity/ Functional Status:  Patient is independent with mobility/ambulation, transfers, ADL's, IADL's.   Zubrod Score: At the time of surgery this patient's most appropriate activity status/level should be described as: '[]'$     0    Normal activity, no symptoms '[]'$     1     Restricted in physical strenuous activity but ambulatory, able to do out light work '[x]'$     2    Ambulatory and capable of self care, unable to do work activities, up and about               >50 % of waking hours                              '[]'$     3    Only limited self care, in bed greater than 50% of waking hours '[]'$     4    Completely disabled, no self care, confined to bed or chair '[]'$     5    Moribund   Past Medical History  Diagnosis Date  . Hypertension   . Emphysema   . Cancer of neck   . Lymph node cancer   . Squamous cell lung cancer     right upper lobe  . Aneurysm   . Radiation 04/17/14, 04/19/14, 04/23/14, 04/25/14, 04/27/14    SBRT to right upper lobe 55 gray    Past Surgical History  Procedure Laterality Date  . Partial rll resection      [facility] March 09'  . Cardiac valve surgery      01/28/2009 AVR with pericardial tissue  valve  . Tonsilectomy/adenoidectomy with myringotomy    . Tumor removal      left neck 2007    Family History  Problem Relation Age of Onset  . Emphysema Brother     smoked    History   Social History  . Marital Status: Married    Spouse Name: N/A  . Number of Children: 1  . Years of Education: N/A   Occupational History  . Retired    Social History Main Topics  . Smoking status: Former Smoker -- 2.00 packs/day for 45 years    Types: Cigarettes    Quit date: 03/02/2000  . Smokeless tobacco: Current User    Types: Chew  . Alcohol Use: No  . Drug Use: No  . Sexual Activity: Not on file   Other Topics Concern  . Not on file   Social History Narrative    History  Smoking status  . Former Smoker -- 2.00 packs/day for 45 years  . Types: Cigarettes  . Quit date: 03/02/2000  Smokeless tobacco  . Current User  . Types: Chew    History  Alcohol Use No     No Known Allergies  Current Outpatient Prescriptions  Medication Sig Dispense Refill  . albuterol (PROAIR HFA) 108 (90 BASE) MCG/ACT inhaler Inhale 2 puffs into the lungs  every 6 (six) hours as needed for wheezing.    Marland Kitchen albuterol (PROVENTIL) (2.5 MG/3ML) 0.083% nebulizer solution Take 2.5 mg by nebulization every 6 (six) hours as needed for wheezing or shortness of breath.    Marland Kitchen aspirin 325 MG tablet Take 325 mg by mouth daily.    . budesonide (PULMICORT) 0.5 MG/2ML nebulizer solution Take 0.5 mg by nebulization 2 (two) times daily.    . clonazePAM (KLONOPIN) 1 MG tablet Take 1 mg by mouth 2 (two) times daily as needed for anxiety.    . digoxin (LANOXIN) 0.125 MG tablet Take 0.125 mg by mouth daily.    Marland Kitchen ezetimibe (ZETIA) 10 MG tablet Take 10 mg by mouth daily.    . formoterol (PERFOROMIST) 20 MCG/2ML nebulizer solution Take 20 mcg by nebulization 2 (two) times daily.    Marland Kitchen losartan-hydrochlorothiazide (HYZAAR) 50-12.5 MG per tablet Take 1 tablet by mouth daily as needed.    . metoprolol tartrate (LOPRESSOR) 25 MG tablet 1/2 tablet twice per day    . nitroGLYCERIN (NITROSTAT) 0.4 MG SL tablet Place 0.4 mg under the tongue every 5 (five) minutes as needed for chest pain.    . Omega-3 Fatty Acids (FISH OIL) 1000 MG CAPS Take 2 capsules by mouth daily.    . vitamin E 400 UNIT capsule Take 400 Units by mouth daily.     No current facility-administered medications for this visit.      Review of Systems:     Cardiac Review of Systems: Y or N  Chest Pain [  n  ]  Resting SOB [  y ] Exertional SOB  Blue.Reese  ]  Orthopnea Blue.Reese  ]   Pedal Edema Blue.Reese   ]    Palpitations [ n ] Syncope  [ n ]   Presyncope [n   ]  General Review of Systems: [Y] = yes [  ]=no Constitional: recent weight change [  ];  Wt loss over the last 3 months [   ] anorexia [  ]; fatigue Blue.Reese  ]; nausea [  ]; night sweats [  ]; fever [  ]; or chills [  ];  Dental: poor dentition[  ]; Last Dentist visit:   Eye : blurred vision [  ]; diplopia [   ]; vision changes [  ];  Amaurosis fugax[  ]; Resp: cough [  ];  wheezing[  ];  hemoptysis[  ]; shortness of breath[  ]; paroxysmal nocturnal dyspnea[ y ]; dyspnea  on exertion[ y ]; or orthopnea[  ];  GI:  gallstones[  ], vomiting[  ];  dysphagia[  ]; melena[  ];  hematochezia [  ]; heartburn[  ];   Hx of  Colonoscopy[  ]; GU: kidney stones [  ]; hematuria[  ];   dysuria [  ];  nocturia[  ];  history of     obstruction [  ]; urinary frequency [  ]             Skin: rash, swelling[  ];, hair loss[  ];  peripheral edema[  ];  or itching[  ]; Musculosketetal: myalgias[  ];  joint swelling[  ];  joint erythema[  ];  joint pain[  ];  back pain[  y];  Heme/Lymph: bruising[  ];  bleeding[  ];  anemia[  ];  Neuro: TIA[  ];  headaches[  ];  stroke[n  ];  vertigo[n  ];  seizures[n  ];   paresthesias[  ];  difficulty walking[y  ];  Psych:depression[  ]; anxiety[  ];  Endocrine: diabetes[  ];  thyroid dysfunction[  ];  Immunizations: Flu up to date [  ]; Pneumococcal up to date [  ];  Other:  Physical Exam: BP 135/90 mmHg  Pulse 92  Resp 20  Ht '5\' 8"'$  (1.727 m)  Wt 174 lb (78.926 kg)  BMI 26.46 kg/m2  SpO2 95%  PHYSICAL EXAMINATION: General appearance: alert, cooperative, appears older than stated age and no distress Head: Normocephalic, without obvious abnormality, atraumatic Neck: no adenopathy, no carotid bruit, no JVD, supple, symmetrical, trachea midline, thyroid not enlarged, symmetric, no tenderness/mass/nodules and Evidence of previous left radical neck, without palpable nodes Lymph nodes: Cervical, supraclavicular, and axillary nodes normal. Resp: diminished breath sounds bilaterally Back: symmetric, no curvature. ROM normal. No CVA tenderness. Cardio: regular rate and rhythm, S1, S2 normal, no murmur, click, rub or gallop GI: soft, non-tender; bowel sounds normal; no masses,  no organomegaly Extremities: extremities normal, atraumatic, no cyanosis or edema and Homans sign is negative, no sign of DVT Neurologic: Grossly normal  Diagnostic Studies & Laboratory data:     Recent Radiology Findings:  CT scan done at 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    I have independently reviewed the above radiology studies  and reviewed the findings with the patient.   Recent Lab Findings: Lab Results  Component Value Date   WBC 8.1 06/04/2009   HGB 14.1 06/04/2009   HCT 41.7 06/04/2009   PLT 131* 06/04/2009   GLUCOSE 152* 06/04/2009   ALT 16 06/04/2009   AST 20 06/04/2009   NA 140 06/04/2009   K 4.3 06/04/2009   CL 105 06/04/2009   CREATININE 0.90 06/04/2009   BUN 5* 06/04/2009   CO2 32 06/04/2009   INR 1.06 06/04/2009      Assessment / Plan:   Patient has  developed a chronic descending type I aortic dissection which is currently asymptomatic. This raises a difficult surgical dilemma is typically this is treated with emergency surgical therapy when acute. The patient has a very poor operative candidate from a functional and respiratory standpoint. At this point I discussed with the patient frankly the risks and options involved with treatment versus nonsurgical treatment. After this discussion he prefers not to proceed with any surgical intervention which I completely agree with and have encouraged him to be diligent about blood pressure control. We will obtain a follow-up CT scan in approximate 4 months which can evaluate both his aorta and any progression of lung malignancy.      I  spent 40 minutes counseling the patient face to face and 50% or more the  time was spent in counseling and coordination of care. The total time spent in the appointment was 60 minutes.  Grace Isaac MD      [address] [place] [address] Office [phone]   Beeper [phone]  10/04/2014 4:09  PM

## 2014-10-05 ENCOUNTER — Ambulatory Visit: Payer: Medicare Other | Admitting: Cardiothoracic Surgery

## 2014-10-05 DIAGNOSIS — E785 Hyperlipidemia, unspecified: Secondary | ICD-10-CM | POA: Insufficient documentation

## 2014-10-05 DIAGNOSIS — C3490 Malignant neoplasm of unspecified part of unspecified bronchus or lung: Secondary | ICD-10-CM | POA: Insufficient documentation

## 2014-10-05 HISTORY — DX: Hyperlipidemia, unspecified: E78.5

## 2014-10-05 HISTORY — DX: Malignant neoplasm of unspecified part of unspecified bronchus or lung: C34.90

## 2014-10-08 DIAGNOSIS — I10 Essential (primary) hypertension: Secondary | ICD-10-CM | POA: Diagnosis not present

## 2014-10-08 DIAGNOSIS — I7101 Dissection of thoracic aorta: Secondary | ICD-10-CM | POA: Diagnosis not present

## 2014-10-08 DIAGNOSIS — E782 Mixed hyperlipidemia: Secondary | ICD-10-CM | POA: Diagnosis not present

## 2014-10-08 DIAGNOSIS — Z952 Presence of prosthetic heart valve: Secondary | ICD-10-CM | POA: Insufficient documentation

## 2014-10-08 HISTORY — DX: Presence of prosthetic heart valve: Z95.2

## 2014-12-04 DIAGNOSIS — F321 Major depressive disorder, single episode, moderate: Secondary | ICD-10-CM | POA: Diagnosis not present

## 2014-12-04 DIAGNOSIS — B070 Plantar wart: Secondary | ICD-10-CM | POA: Diagnosis not present

## 2014-12-04 DIAGNOSIS — Z23 Encounter for immunization: Secondary | ICD-10-CM | POA: Diagnosis not present

## 2014-12-04 DIAGNOSIS — R51 Headache: Secondary | ICD-10-CM | POA: Diagnosis not present

## 2014-12-04 DIAGNOSIS — J449 Chronic obstructive pulmonary disease, unspecified: Secondary | ICD-10-CM | POA: Diagnosis not present

## 2014-12-04 DIAGNOSIS — R7301 Impaired fasting glucose: Secondary | ICD-10-CM | POA: Diagnosis not present

## 2014-12-04 DIAGNOSIS — E7800 Pure hypercholesterolemia, unspecified: Secondary | ICD-10-CM | POA: Diagnosis not present

## 2014-12-04 DIAGNOSIS — E782 Mixed hyperlipidemia: Secondary | ICD-10-CM | POA: Diagnosis not present

## 2014-12-04 DIAGNOSIS — I10 Essential (primary) hypertension: Secondary | ICD-10-CM | POA: Diagnosis not present

## 2014-12-13 ENCOUNTER — Ambulatory Visit
Admission: RE | Admit: 2014-12-13 | Discharge: 2014-12-13 | Disposition: A | Discharge status: Home / Self Care | Payer: Medicare Other | Source: Ambulatory Visit | Attending: Radiation Oncology | Admitting: Radiation Oncology

## 2014-12-13 ENCOUNTER — Encounter: Payer: Self-pay | Admitting: Radiation Oncology

## 2014-12-13 VITALS — BP 134/82 | HR 92 | Temp 98.2°F | Resp 20 | Ht 68.0 in | Wt 174.3 lb

## 2014-12-13 DIAGNOSIS — C3491 Malignant neoplasm of unspecified part of right bronchus or lung: Secondary | ICD-10-CM

## 2014-12-13 NOTE — Progress Notes (Signed)
Tommy [name] here for follow up.  He denies pain except for a headache from his bp medications.  He reports having an occasional cough with white sputum.  He reports shortness of breath and uses 2L of oxygen at night.  He reports he is not going to have surgery for the aortic dissection.  He reports his energy level is poor.  BP 134/82 mmHg  Pulse 92  Temp(Src) 98.2 F (36.8 C) (Oral)  Resp 20  Ht '5\' 8"'$  (1.727 m)  Wt 174 lb 4.8 oz (79.062 kg)  BMI 26.51 kg/m2  SpO2 94%

## 2014-12-13 NOTE — Progress Notes (Signed)
Radiation Oncology         [phone] ________________________________  Name: Tommy [name] MRN: [id number]  Date: 12/13/2014  DOB: 1940/12/15  Follow-Up Visit Note  CC: [name], MD  [name], MD  No diagnosis found.  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            February 16, February 18, February 22, February 24, April 27, 2014 (Right upper lobe, 55 gray in 5 fractions, 11 Gy/fx)           07/01/2009-08/16/2009 (Right hilar area, 68 Gy in 34 fractions)  Narrative:  The patient returns today for routine follow-up. He denies pain except for a headache. He reports having an occasional cough with white sputum. He reports shortness of breath and uses 2L of oxygen at night. He reports he is not going to have surgery for the aortic dissection due to the operative risks.  He reports his energy level is poor. He denies hemoptysis. His most recent chest CT was on 09/12/14 with radiation changes, no active cancer but aneurysm. The patient did see Dr. Servando Snare concerning this issue.  ALLERGIES:  has No Known Allergies.  Meds: Current Outpatient Prescriptions  Medication Sig Dispense Refill  . albuterol (PROAIR HFA) 108 (90 BASE) MCG/ACT inhaler Inhale 2 puffs into the lungs every 6 (six) hours as needed for wheezing.    Marland Kitchen albuterol (PROVENTIL) (2.5 MG/3ML) 0.083% nebulizer solution Take 2.5 mg by nebulization every 6 (six) hours as needed for wheezing or shortness of breath.    Marland Kitchen aspirin 325 MG tablet Take 325 mg by mouth daily.    . budesonide (PULMICORT) 0.5 MG/2ML nebulizer solution Take 0.5 mg by nebulization 2 (two) times daily.    . clonazePAM (KLONOPIN) 1 MG tablet Take 1 mg by mouth 2 (two) times daily as needed for anxiety.    . digoxin (LANOXIN) 0.125 MG tablet Take 0.125 mg by mouth daily.    Marland Kitchen ezetimibe (ZETIA) 10 MG tablet Take 10 mg by mouth daily.    Marland Kitchen  losartan-hydrochlorothiazide (HYZAAR) 50-12.5 MG per tablet Take 1 tablet by mouth daily as needed.    . metoprolol tartrate (LOPRESSOR) 25 MG tablet 1/2 tablet twice per day    . Omega-3 Fatty Acids (FISH OIL) 1000 MG CAPS Take 2 capsules by mouth daily.    . vitamin E 400 UNIT capsule Take 400 Units by mouth daily.    . citalopram (CELEXA) 20 MG tablet Take 20 mg by mouth daily.  0  . formoterol (PERFOROMIST) 20 MCG/2ML nebulizer solution Take 20 mcg by nebulization 2 (two) times daily.    . nitroGLYCERIN (NITROSTAT) 0.4 MG SL tablet Place 0.4 mg under the tongue every 5 (five) minutes as needed for chest pain.     No current facility-administered medications for this encounter.    Physical Findings: The patient is in no acute distress. Patient is alert and oriented.  height is '5\' 8"'$  (1.727 m) and weight is 174 lb 4.8 oz (79.062 kg). His oral temperature is 98.2 F (36.8 C). His blood pressure is 134/82 and his pulse is 92. His respiration is 20 and oxygen saturation is 94%.  No palpable supraclavicular or axillary adenopathy. The heart has a regular rhythm and rate. Bilateral mild inspiratory and expiratory wheezing.  Lab Findings: Lab Results  Component Value Date   WBC 8.1 06/04/2009   HGB 14.1 06/04/2009  HCT 41.7 06/04/2009   MCV 94.7 06/04/2009   PLT 131* 06/04/2009    Radiographic Findings: No results found.  Impression:  The patient is recovering from the effects of radiation.  No evidence of recurrence on clinical exam today.  Plan: The pt will have a chest CT in December and a follow up with Dr. Servando Snare afterwards. This chest CT will f/u for his aneurysm and lung cancer. The pt will see me on a PRN basis.  This document serves as a record of services personally performed by Gery Pray, MD. It was created on his behalf by Darcus Austin, a trained medical scribe. The creation of this record is based on the scribe's personal observations and the provider's statements to  them. This document has been checked and approved by the attending provider.  -----------------------------------  Blair Promise, PhD, MD

## 2014-12-21 DIAGNOSIS — I719 Aortic aneurysm of unspecified site, without rupture: Secondary | ICD-10-CM | POA: Diagnosis not present

## 2014-12-21 DIAGNOSIS — I714 Abdominal aortic aneurysm, without rupture: Secondary | ICD-10-CM | POA: Diagnosis not present

## 2014-12-21 DIAGNOSIS — Z95820 Peripheral vascular angioplasty status with implants and grafts: Secondary | ICD-10-CM | POA: Diagnosis not present

## 2015-01-01 DIAGNOSIS — I714 Abdominal aortic aneurysm, without rupture: Secondary | ICD-10-CM | POA: Diagnosis not present

## 2015-01-01 DIAGNOSIS — Z9889 Other specified postprocedural states: Secondary | ICD-10-CM | POA: Diagnosis not present

## 2015-01-01 DIAGNOSIS — I10 Essential (primary) hypertension: Secondary | ICD-10-CM | POA: Diagnosis not present

## 2015-01-01 DIAGNOSIS — Z8679 Personal history of other diseases of the circulatory system: Secondary | ICD-10-CM | POA: Diagnosis not present

## 2015-01-01 DIAGNOSIS — I70203 Unspecified atherosclerosis of native arteries of extremities, bilateral legs: Secondary | ICD-10-CM | POA: Diagnosis not present

## 2015-02-14 DIAGNOSIS — J441 Chronic obstructive pulmonary disease with (acute) exacerbation: Secondary | ICD-10-CM | POA: Diagnosis not present

## 2015-02-20 ENCOUNTER — Other Ambulatory Visit: Payer: Self-pay | Admitting: *Deleted

## 2015-02-20 DIAGNOSIS — I7101 Dissection of ascending aorta: Secondary | ICD-10-CM

## 2015-02-20 DIAGNOSIS — C3411 Malignant neoplasm of upper lobe, right bronchus or lung: Secondary | ICD-10-CM

## 2015-02-28 ENCOUNTER — Other Ambulatory Visit: Payer: Self-pay | Admitting: *Deleted

## 2015-02-28 DIAGNOSIS — I7101 Dissection of ascending aorta: Secondary | ICD-10-CM

## 2015-02-28 DIAGNOSIS — C3491 Malignant neoplasm of unspecified part of right bronchus or lung: Secondary | ICD-10-CM

## 2015-03-12 ENCOUNTER — Encounter: Payer: Self-pay | Admitting: Cardiothoracic Surgery

## 2015-03-12 DIAGNOSIS — I7101 Dissection of thoracic aorta: Secondary | ICD-10-CM | POA: Diagnosis not present

## 2015-03-12 DIAGNOSIS — C3491 Malignant neoplasm of unspecified part of right bronchus or lung: Secondary | ICD-10-CM | POA: Diagnosis not present

## 2015-03-13 DIAGNOSIS — L578 Other skin changes due to chronic exposure to nonionizing radiation: Secondary | ICD-10-CM | POA: Diagnosis not present

## 2015-03-13 DIAGNOSIS — L570 Actinic keratosis: Secondary | ICD-10-CM | POA: Diagnosis not present

## 2015-03-13 DIAGNOSIS — L821 Other seborrheic keratosis: Secondary | ICD-10-CM | POA: Diagnosis not present

## 2015-03-14 ENCOUNTER — Ambulatory Visit
Admission: RE | Admit: 2015-03-14 | Discharge: 2015-03-14 | Disposition: A | Discharge status: Home / Self Care | Payer: Medicare Other | Source: Ambulatory Visit | Attending: Cardiothoracic Surgery | Admitting: Cardiothoracic Surgery

## 2015-03-14 ENCOUNTER — Encounter: Payer: Self-pay | Admitting: Cardiothoracic Surgery

## 2015-03-14 ENCOUNTER — Ambulatory Visit (INDEPENDENT_AMBULATORY_CARE_PROVIDER_SITE_OTHER): Payer: Medicare Other | Admitting: Cardiothoracic Surgery

## 2015-03-14 VITALS — BP 114/84 | HR 100 | Resp 20 | Ht 68.0 in | Wt 176.0 lb

## 2015-03-14 DIAGNOSIS — I7101 Dissection of ascending aorta: Secondary | ICD-10-CM

## 2015-03-14 DIAGNOSIS — I712 Thoracic aortic aneurysm, without rupture: Secondary | ICD-10-CM | POA: Diagnosis not present

## 2015-03-14 DIAGNOSIS — I71019 Dissection of thoracic aorta, unspecified: Secondary | ICD-10-CM

## 2015-03-14 DIAGNOSIS — C3411 Malignant neoplasm of upper lobe, right bronchus or lung: Secondary | ICD-10-CM

## 2015-03-14 MED ORDER — IOPAMIDOL (ISOVUE-370) INJECTION 76%
75.0000 mL | Freq: Once | INTRAVENOUS | Status: AC | PRN
  Administered 2015-03-14: 75 mL via INTRAVENOUS

## 2015-03-14 NOTE — Progress Notes (Signed)
[address]       [place] [address]             [phone]                    Zaylin L [facility] Medical Record #[id number] Date of Birth: 04/17/1940  Referring: Gery Pray, MD Primary Care: [name], MD  Chief Complaint:    Chief Complaint  Patient presents with  . Follow-up    5 month f/u with CTA Chest    History of Present Illness:    Tommy [name] 75 y.o. male is seen in the office  today for follow-up CTA of the chest . In August 2016 I saw the patient at the urgent request  of radiation oncology because of the finding of a ascending aortic dissection on CT scan of the chest done in [facility] July 13. At the time of the initial diagnosis of acute aortic dissection the patient was not seen by cardiac surgery.  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 The patient is also had multiple cancers including wedge resection of the right lower lobe and [facility] in 2008. A radical left radical neck dissection for squamous cell carcinoma. IN 2011 he had recurrence of squamous cell carcinoma in a right hilar nodes. 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 Patient has very poor lung function, on home oxygen at night and with very limited pulmonary reserve.  There was no evidence of dissection in January 2016  CT scan of the chest The patient denies any episodes of chest pain.  The patient is asymptomatic from.  He does have shortness of breath but denies chest pain .   Current Activity/ Functional Status:  Patient is independent with mobility/ambulation, transfers, ADL's, IADL's.   Zubrod Score: At the time of surgery this patient's most appropriate activity status/level should be described as: '[]'$     0     Normal activity, no symptoms '[]'$     1    Restricted in physical strenuous activity but ambulatory, able to do out light work '[x]'$     2    Ambulatory and capable of self care, unable to do work activities, up and about               >50 % of waking hours                              '[]'$     3    Only limited self care, in bed greater than 50% of waking hours '[]'$     4    Completely disabled, no self care, confined to bed or chair '[]'$     5    Moribund   Past Medical History  Diagnosis Date  . Hypertension   . Emphysema   . Cancer of neck ([place])   . 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 Squamous cell lung cancer ([facility])     right upper lobe  . Aneurysm ([place])   . Radiation 04/17/14, 04/19/14, 04/23/14, 04/25/14, 04/27/14    SBRT to right upper lobe 55 gray    Past Surgical History  Procedure Laterality Date  . Partial rll resection  [facility] March 09'  . Cardiac valve surgery      01/28/2009 AVR with pericardial tissue valve  . Tonsilectomy/adenoidectomy with myringotomy    . Tumor removal      left neck 2007    Family History  Problem Relation Age of Onset  . Emphysema Brother     smoked    Social History   Social History  . Marital Status: Married    Spouse Name: N/A  . Number of Children: 1  . Years of Education: N/A   Occupational History  . Retired    Social History Main Topics  . Smoking status: Former Smoker -- 2.00 packs/day for 45 years    Types: Cigarettes    Quit date: 03/02/2000  . Smokeless tobacco: Current User    Types: Chew  . Alcohol Use: No  . Drug Use: No  . Sexual Activity: Not on file   Other Topics Concern  . Not on file   Social History Narrative    History  Smoking status  . Former Smoker -- 2.00 packs/day for 45 years  . Types: Cigarettes  . Quit date: 03/02/2000  Smokeless tobacco  . Current User  . Types: Chew    History  Alcohol Use No     No Known Allergies  Current Outpatient Prescriptions  Medication Sig Dispense Refill  . albuterol  (PROAIR HFA) 108 (90 BASE) MCG/ACT inhaler Inhale 2 puffs into the lungs every 6 (six) hours as needed for wheezing.    Marland Kitchen albuterol (PROVENTIL) (2.5 MG/3ML) 0.083% nebulizer solution Take 2.5 mg by nebulization every 6 (six) hours as needed for wheezing or shortness of breath.    Marland Kitchen aspirin 325 MG tablet Take 325 mg by mouth daily.    . budesonide (PULMICORT) 0.5 MG/2ML nebulizer solution Take 0.5 mg by nebulization 2 (two) times daily.    . clonazePAM (KLONOPIN) 1 MG tablet Take 1 mg by mouth 2 (two) times daily as needed for anxiety.    . digoxin (LANOXIN) 0.125 MG tablet Take 0.125 mg by mouth daily.    Marland Kitchen ezetimibe (ZETIA) 10 MG tablet Take 10 mg by mouth daily.    . formoterol (PERFOROMIST) 20 MCG/2ML nebulizer solution Take 20 mcg by nebulization 2 (two) times daily.    Marland Kitchen losartan-hydrochlorothiazide (HYZAAR) 50-12.5 MG per tablet Take 1 tablet by mouth daily as needed.    . metoprolol tartrate (LOPRESSOR) 25 MG tablet 1/2 tablet twice per day    . nitroGLYCERIN (NITROSTAT) 0.4 MG SL tablet Place 0.4 mg under the tongue every 5 (five) minutes as needed for chest pain.    . Omega-3 Fatty Acids (FISH OIL) 1000 MG CAPS Take 2 capsules by mouth daily.    . vitamin E 400 UNIT capsule Take 400 Units by mouth daily.     No current facility-administered medications for this visit.      Review of Systems:     Cardiac Review of Systems: Y or N  Chest Pain [  n  ]  Resting SOB [  y ] Exertional SOB  Blue.Reese  ]  Orthopnea Blue.Reese  ]   Pedal Edema Blue.Reese   ]    Palpitations [ n ] Syncope  [ n ]   Presyncope [n   ]  General Review of Systems: [Y] = yes [  ]=no Constitional: recent weight change [  ];  Wt loss over the last 3 months [   ] anorexia [  ]; fatigue Blue.Reese  ];  nausea [  ]; night sweats [  ]; fever [  ]; or chills [  ];          Dental: poor dentition[  ]; Last Dentist visit:   Eye : blurred vision [  ]; diplopia [   ]; vision changes [  ];  Amaurosis fugax[  ]; Resp: cough [  ];  wheezing[  ];  hemoptysis[   ]; shortness of breath[  ]; paroxysmal nocturnal dyspnea[ y ]; dyspnea on exertion[ y ]; or orthopnea[  ];  GI:  gallstones[  ], vomiting[  ];  dysphagia[  ]; melena[  ];  hematochezia [  ]; heartburn[  ];   Hx of  Colonoscopy[  ]; GU: kidney stones [  ]; hematuria[  ];   dysuria [  ];  nocturia[  ];  history of     obstruction [  ]; urinary frequency [  ]             Skin: rash, swelling[  ];, hair loss[  ];  peripheral edema[  ];  or itching[  ]; Musculosketetal: myalgias[  ];  joint swelling[  ];  joint erythema[  ];  joint pain[  ];  back pain[  y];  Heme/Lymph: bruising[  ];  bleeding[  ];  anemia[  ];  Neuro: TIA[  ];  headaches[  ];  stroke[n  ];  vertigo[n  ];  seizures[n  ];   paresthesias[  ];  difficulty walking[y  ];  Psych:depression[  ]; anxiety[  ];  Endocrine: diabetes[  ];  thyroid dysfunction[  ];  Immunizations: Flu up to date [  ]; Pneumococcal up to date [  ];  Other:  Physical Exam: BP 114/84 mmHg  Pulse 100  Resp 20  Ht '5\' 8"'$  (1.727 m)  Wt 176 lb (79.833 kg)  BMI 26.77 kg/m2  SpO2 96%  PHYSICAL EXAMINATION: General appearance: alert, cooperative, appears older than stated age and no distress Head: Normocephalic, without obvious abnormality, atraumatic Neck: no adenopathy, no carotid bruit, no JVD, supple, symmetrical, trachea midline, thyroid not enlarged, symmetric, no tenderness/mass/nodules and Evidence of previous left radical neck, without palpable nodes Lymph nodes: Cervical, supraclavicular, and axillary nodes normal. Resp: diminished breath sounds bilaterally Back: symmetric, no curvature. ROM normal. No CVA tenderness. Cardio: regular rate and rhythm, S1, S2 normal, no murmur, click, rub or gallop GI: soft, non-tender; bowel sounds normal; no masses,  no organomegaly Extremities: extremities normal, atraumatic, no cyanosis or edema and Homans sign is negative, no sign of DVT Neurologic: Grossly normal  Diagnostic Studies & Laboratory data:      Recent Radiology Findings:  Ct Angio Chest Aorta W/cm &/or Wo/cm  03/14/2015  CLINICAL DATA:  Follow-up aortic dissection. History of right lung cancer in 2009, status post surgery, with recurrence to left supraclavicular port and right hilum, status post chemotherapy and radiation. Shortness of breath, cough. EXAM: CT ANGIOGRAPHY CHEST WITH CONTRAST TECHNIQUE: Multidetector CT imaging of the chest was performed using the standard protocol during bolus administration of intravenous contrast. Multiplanar CT image reconstructions and MIPs were obtained to evaluate the vascular anatomy. CONTRAST:  75 mL Isovue 370 IV COMPARISON:  CT chest dated 09/12/2014 FINDINGS: Prosthetic aortic valve. No evidence of intramural hematoma on unenhanced CT. Stable ascending thoracic aortic dissection arising just distal to the prosthetic valve (series 4/ image 64) and extending to the mid arch (series 4/image 39). Although not tailored for evaluation of the pulmonary arteries, there is no evidence  of pulmonary embolism. Ascending thoracic aortic aneurysm, measuring 5.3 cm at the level of the proximal descending thoracic aorta (series 2/image 16), previously 5.1 cm when measured in a similar fashion. Mediastinum/Nodes: Heart is normal in size. No evidence or pericardial effusion. Coronary atherosclerosis. Small mediastinal lymph nodes which do not meet pathologic CT size criteria. 14 mm calcified right perihilar node (series 2/ image 19), unchanged. Visualized thyroid is unremarkable. Lungs/Pleura: 1.3 x 1.8 cm nodule in the posterior right upper lobe (series 5/ image 17), previously 1.3 x 2.0 cm. Surrounding radiation changes. Additional radiation changes in the right perihilar region (series 5/ image 29). Stable 8 mm subpleural nodule in the left lower lobe (series 5/ image 39). Mild lingular scarring (series 5/image 50). Mild compressive atelectasis in the medial right lower lobe. Underlying moderate centrilobular and paraseptal  emphysematous changes. No pleural effusion or pneumothorax. Upper abdomen: Visualized upper abdomen is notable for calcified hepatic and splenic granulomata, vascular calcifications, a large hiatal hernia/intrathoracic stomach, an parenchymal pancreatic calcifications (suggesting sequela of prior/chronic pancreatitis). Musculoskeletal: Degenerative changes of the visualized thoracolumbar spine. Median sternotomy. Review of the MIP images confirms the above findings. IMPRESSION: Status post aortic valve replacement. Stable ascending thoracic aortic dissection. Mild progression of associated ascending thoracic aortic aneurysm, now measuring 5.3 cm, previously 5.1 cm. 1.3 x 1.8 cm nodule in the posterior right upper lobe, mildly decreased. Associated radiation changes. Additional radiation changes in the right perihilar region. Additional stable ancillary findings as above. Electronically Signed   By: Julian Hy M.D.   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    I have independently reviewed the above radiology studies  and reviewed the findings with the patient.   Recent Lab Findings: Lab Results  Component Value Date   WBC 8.1 06/04/2009   HGB 14.1 06/04/2009   HCT 41.7 06/04/2009   PLT 131* 06/04/2009   GLUCOSE 152* 06/04/2009   ALT 16 06/04/2009   AST 20 06/04/2009   NA 140 06/04/2009   K 4.3 06/04/2009   CL 105 06/04/2009   CREATININE 0.90 06/04/2009   BUN 5* 06/04/2009   CO2 32 06/04/2009   INR 1.06 06/04/2009      Assessment / Plan:   Patient has developed a chronic ascending type I aortic dissection which is currently  asymptomatic. This raises a difficult surgical dilemma is typically this is treated with emergency surgical therapy when acute. The patient has a very poor operative candidate from a functional and respiratory standpoint. I have again discussed with the patient frankly the risks and options involved with treatment versus nonsurgical treatment. After this discussion he prefers not to proceed with any surgical intervention which I completely agree with and have encouraged him to be diligent about blood pressure control. We will obtain a follow-up CT scan in approximate 6 months which can evaluate both his aorta and any progression of lung malignancy.  Follow-up scan today shows some decrease in the right lung nodule which was treated with stereotactic therapy.     Grace Isaac MD      [address] [place] [address] Office [phone]  Beeper [phone]  03/14/2015 4:58 PM

## 2015-04-02 DIAGNOSIS — F320 Major depressive disorder, single episode, mild: Secondary | ICD-10-CM | POA: Diagnosis not present

## 2015-04-02 DIAGNOSIS — E119 Type 2 diabetes mellitus without complications: Secondary | ICD-10-CM | POA: Diagnosis not present

## 2015-04-02 DIAGNOSIS — E1165 Type 2 diabetes mellitus with hyperglycemia: Secondary | ICD-10-CM | POA: Diagnosis not present

## 2015-04-02 DIAGNOSIS — J410 Simple chronic bronchitis: Secondary | ICD-10-CM | POA: Diagnosis not present

## 2015-04-02 DIAGNOSIS — I10 Essential (primary) hypertension: Secondary | ICD-10-CM | POA: Diagnosis not present

## 2015-04-02 DIAGNOSIS — E782 Mixed hyperlipidemia: Secondary | ICD-10-CM | POA: Diagnosis not present

## 2015-04-08 DIAGNOSIS — I10 Essential (primary) hypertension: Secondary | ICD-10-CM | POA: Diagnosis not present

## 2015-04-08 DIAGNOSIS — C3400 Malignant neoplasm of unspecified main bronchus: Secondary | ICD-10-CM | POA: Diagnosis not present

## 2015-04-08 DIAGNOSIS — E782 Mixed hyperlipidemia: Secondary | ICD-10-CM | POA: Diagnosis not present

## 2015-04-08 DIAGNOSIS — I7101 Dissection of thoracic aorta: Secondary | ICD-10-CM | POA: Diagnosis not present

## 2015-04-08 DIAGNOSIS — Z952 Presence of prosthetic heart valve: Secondary | ICD-10-CM | POA: Diagnosis not present

## 2015-04-17 ENCOUNTER — Telehealth: Payer: Self-pay | Admitting: Oncology

## 2015-04-17 NOTE — Telephone Encounter (Signed)
Tommy [name]'s wife called and said that he had a CT Scan of his chest on 03/14/15.  They said Dr. Servando Snare told them the lung cancer is decreasing.  They want to know if it will keep decreasing in size.  They would like Dr. Sondra Come to review the scan.  Advised them that Dr. Sondra Come will be notified and we will call them back.

## 2015-05-01 DIAGNOSIS — R799 Abnormal finding of blood chemistry, unspecified: Secondary | ICD-10-CM | POA: Diagnosis not present

## 2015-07-01 DIAGNOSIS — R5383 Other fatigue: Secondary | ICD-10-CM | POA: Diagnosis not present

## 2015-07-01 DIAGNOSIS — F320 Major depressive disorder, single episode, mild: Secondary | ICD-10-CM | POA: Diagnosis not present

## 2015-07-01 DIAGNOSIS — E782 Mixed hyperlipidemia: Secondary | ICD-10-CM | POA: Diagnosis not present

## 2015-07-01 DIAGNOSIS — M791 Myalgia: Secondary | ICD-10-CM | POA: Diagnosis not present

## 2015-07-01 DIAGNOSIS — E1165 Type 2 diabetes mellitus with hyperglycemia: Secondary | ICD-10-CM | POA: Diagnosis not present

## 2015-07-01 DIAGNOSIS — J410 Simple chronic bronchitis: Secondary | ICD-10-CM | POA: Diagnosis not present

## 2015-07-01 DIAGNOSIS — I10 Essential (primary) hypertension: Secondary | ICD-10-CM | POA: Diagnosis not present

## 2015-07-01 DIAGNOSIS — R7301 Impaired fasting glucose: Secondary | ICD-10-CM | POA: Diagnosis not present

## 2015-07-11 DIAGNOSIS — B079 Viral wart, unspecified: Secondary | ICD-10-CM | POA: Diagnosis not present

## 2015-07-11 DIAGNOSIS — L570 Actinic keratosis: Secondary | ICD-10-CM | POA: Diagnosis not present

## 2015-08-02 DIAGNOSIS — J441 Chronic obstructive pulmonary disease with (acute) exacerbation: Secondary | ICD-10-CM | POA: Diagnosis not present

## 2015-08-15 DIAGNOSIS — B079 Viral wart, unspecified: Secondary | ICD-10-CM | POA: Diagnosis not present

## 2015-08-15 DIAGNOSIS — L570 Actinic keratosis: Secondary | ICD-10-CM | POA: Diagnosis not present

## 2015-08-15 DIAGNOSIS — L578 Other skin changes due to chronic exposure to nonionizing radiation: Secondary | ICD-10-CM | POA: Diagnosis not present

## 2015-08-15 DIAGNOSIS — B070 Plantar wart: Secondary | ICD-10-CM | POA: Diagnosis not present

## 2015-08-22 DIAGNOSIS — R05 Cough: Secondary | ICD-10-CM | POA: Diagnosis not present

## 2015-08-22 DIAGNOSIS — J449 Chronic obstructive pulmonary disease, unspecified: Secondary | ICD-10-CM | POA: Diagnosis not present

## 2015-08-22 DIAGNOSIS — R0789 Other chest pain: Secondary | ICD-10-CM | POA: Diagnosis not present

## 2015-08-22 DIAGNOSIS — R5383 Other fatigue: Secondary | ICD-10-CM | POA: Diagnosis not present

## 2015-08-22 DIAGNOSIS — K449 Diaphragmatic hernia without obstruction or gangrene: Secondary | ICD-10-CM | POA: Diagnosis not present

## 2015-08-22 DIAGNOSIS — K429 Umbilical hernia without obstruction or gangrene: Secondary | ICD-10-CM | POA: Diagnosis not present

## 2015-08-27 ENCOUNTER — Other Ambulatory Visit: Payer: Self-pay | Admitting: *Deleted

## 2015-08-27 DIAGNOSIS — I7101 Dissection of ascending aorta: Secondary | ICD-10-CM

## 2015-09-18 DIAGNOSIS — I714 Abdominal aortic aneurysm, without rupture: Secondary | ICD-10-CM | POA: Diagnosis not present

## 2015-09-26 ENCOUNTER — Encounter: Payer: Self-pay | Admitting: Cardiothoracic Surgery

## 2015-09-26 ENCOUNTER — Ambulatory Visit (INDEPENDENT_AMBULATORY_CARE_PROVIDER_SITE_OTHER): Payer: Medicare Other | Admitting: Cardiothoracic Surgery

## 2015-09-26 ENCOUNTER — Ambulatory Visit
Admission: RE | Admit: 2015-09-26 | Discharge: 2015-09-26 | Disposition: A | Discharge status: Home / Self Care | Payer: Medicare Other | Source: Ambulatory Visit | Attending: Cardiothoracic Surgery | Admitting: Cardiothoracic Surgery

## 2015-09-26 VITALS — BP 132/83 | HR 90 | Resp 20 | Ht 68.0 in | Wt 175.0 lb

## 2015-09-26 DIAGNOSIS — I7101 Dissection of ascending aorta: Secondary | ICD-10-CM

## 2015-09-26 DIAGNOSIS — Z85118 Personal history of other malignant neoplasm of bronchus and lung: Secondary | ICD-10-CM | POA: Diagnosis not present

## 2015-09-26 DIAGNOSIS — I71019 Dissection of thoracic aorta, unspecified: Secondary | ICD-10-CM

## 2015-09-26 MED ORDER — IOPAMIDOL (ISOVUE-370) INJECTION 76%
75.0000 mL | Freq: Once | INTRAVENOUS | Status: AC | PRN
  Administered 2015-09-26: 75 mL via INTRAVENOUS

## 2015-09-26 NOTE — Progress Notes (Signed)
[address]       [place], [address]             [phone]                    Tommy [name] [place] Medical Record #[id number] Date of Birth: 10/26/1940  Referring: Gery Pray, MD Primary Care: [name], MD  Chief Complaint:    Chief Complaint  Patient presents with  . Follow-up    6 month f/u with CTA Chest    History of Present Illness:    Tommy [name] 75 y.o. male is seen in the office  today for follow-up CTA of the chest . In August 2016 I saw the patient at the urgent request  of radiation oncology because of the finding of a ascending aortic dissection on CT scan of the chest done in [facility] July 13. At the time of the initial diagnosis of acute aortic dissection the patient was not seen by cardiac surgery.  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 The patient is also had multiple cancers including wedge resection of the right lower lobe and [facility] in 2008. A radical left radical neck dissection for squamous cell carcinoma. IN 2011 he had recurrence of squamous cell carcinoma in a right hilar nodes. 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 Patient has very poor lung function, on home oxygen at night and with very limited pulmonary reserve.  There was no evidence of dissection in January 2016  CT scan of the chest The patient denies any episodes of chest pain.  The patient is asymptomatic from.  He does have shortness of breath but denies chest pain .   Current Activity/ Functional Status:  Patient is independent with mobility/ambulation, transfers, ADL's, IADL's.   Zubrod Score: At the time of surgery this patient's most appropriate activity status/level should be described as: '[]'$     0     Normal activity, no symptoms '[]'$     1    Restricted in physical strenuous activity but ambulatory, able to do out light work '[x]'$     2    Ambulatory and capable of self care, unable to do work activities, up and about               >50 % of waking hours                              '[]'$     3    Only limited self care, in bed greater than 50% of waking hours '[]'$     4    Completely disabled, no self care, confined to bed or chair '[]'$     5    Moribund   Past Medical History:  Diagnosis Date  . Aneurysm ([place])   . Cancer of neck ([place])   . Emphysema   . Hypertension   . 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 Radiation 04/17/14, 04/19/14, 04/23/14, 04/25/14, 04/27/14   SBRT to right upper lobe 55 gray  . Squamous cell lung cancer ([place])    right upper lobe    Past Surgical History:  Procedure Laterality Date  . CARDIAC VALVE SURGERY  01/28/2009 AVR with pericardial tissue valve  . Partial RLL Resection     [facility] March 09'  . TONSILECTOMY/ADENOIDECTOMY WITH MYRINGOTOMY    . TUMOR REMOVAL     left neck 2007    Family History  Problem Relation Age of Onset  . Emphysema Brother     smoked    Social History   Social History  . Marital status: Married    Spouse name: N/A  . Number of children: 1  . Years of education: N/A   Occupational History  . Retired Retired   Social History Main Topics  . Smoking status: Former Smoker    Packs/day: 2.00    Years: 45.00    Types: Cigarettes    Quit date: 03/02/2000  . Smokeless tobacco: Current User    Types: Chew  . Alcohol use No  . Drug use: No  . Sexual activity: Not on file   Other Topics Concern  . Not on file   Social History Narrative  . No narrative on file    History  Smoking Status  . Former Smoker  . Packs/day: 2.00  . Years: 45.00  . Types: Cigarettes  . Quit date: 03/02/2000  Smokeless Tobacco  . Current User  . Types: Chew    History  Alcohol Use No     No Known Allergies  Current Outpatient Prescriptions    Medication Sig Dispense Refill  . albuterol (PROAIR HFA) 108 (90 BASE) MCG/ACT inhaler Inhale 2 puffs into the lungs every 6 (six) hours as needed for wheezing.    Marland Kitchen albuterol (PROVENTIL) (2.5 MG/3ML) 0.083% nebulizer solution Take 2.5 mg by nebulization every 6 (six) hours as needed for wheezing or shortness of breath.    Marland Kitchen aspirin 325 MG tablet Take 325 mg by mouth daily.    . budesonide (PULMICORT) 0.5 MG/2ML nebulizer solution Take 0.5 mg by nebulization 2 (two) times daily.    . clonazePAM (KLONOPIN) 1 MG tablet Take 1 mg by mouth 2 (two) times daily as needed for anxiety.    . digoxin (LANOXIN) 0.125 MG tablet Take 0.125 mg by mouth daily.    Marland Kitchen ezetimibe (ZETIA) 10 MG tablet Take 10 mg by mouth daily.    . formoterol (PERFOROMIST) 20 MCG/2ML nebulizer solution Take 20 mcg by nebulization 2 (two) times daily.    Marland Kitchen losartan-hydrochlorothiazide (HYZAAR) 50-12.5 MG per tablet Take 1 tablet by mouth daily as needed.    . metoprolol tartrate (LOPRESSOR) 25 MG tablet 1/2 tablet twice per day    . nitroGLYCERIN (NITROSTAT) 0.4 MG SL tablet Place 0.4 mg under the tongue every 5 (five) minutes as needed for chest pain.    . Omega-3 Fatty Acids (FISH OIL) 1000 MG CAPS Take 2 capsules by mouth daily.    . vitamin E 400 UNIT capsule Take 400 Units by mouth daily.     No current facility-administered medications for this visit.       Review of Systems:     Cardiac Review of Systems: Y or N  Chest Pain [  n  ]  Resting SOB [  y ] Exertional SOB  Blue.Reese  ]  Orthopnea Blue.Reese  ]   Pedal Edema Blue.Reese   ]    Palpitations [ n ] Syncope  [ n ]   Presyncope [n   ]  General Review of Systems: [Y] = yes [  ]=no Constitional: recent weight change [  ];  Wt loss over the last 3 months [   ]  anorexia [  ]; fatigue Blue.Reese  ]; nausea [  ]; night sweats [  ]; fever [  ]; or chills [  ];          Dental: poor dentition[  ]; Last Dentist visit:   Eye : blurred vision [  ]; diplopia [   ]; vision changes [  ];  Amaurosis fugax[   ]; Resp: cough [  ];  wheezing[  ];  hemoptysis[  ]; shortness of breath[  ]; paroxysmal nocturnal dyspnea[ y ]; dyspnea on exertion[ y ]; or orthopnea[  ];  GI:  gallstones[  ], vomiting[  ];  dysphagia[  ]; melena[  ];  hematochezia [  ]; heartburn[  ];   Hx of  Colonoscopy[  ]; GU: kidney stones [  ]; hematuria[  ];   dysuria [  ];  nocturia[  ];  history of     obstruction [  ]; urinary frequency [  ]             Skin: rash, swelling[  ];, hair loss[  ];  peripheral edema[  ];  or itching[  ]; Musculosketetal: myalgias[  ];  joint swelling[  ];  joint erythema[  ];  joint pain[  ];  back pain[  y];  Heme/Lymph: bruising[  ];  bleeding[  ];  anemia[  ];  Neuro: TIA[  ];  headaches[  ];  stroke[n  ];  vertigo[n  ];  seizures[n  ];   paresthesias[  ];  difficulty walking[y  ];  Psych:depression[  ]; anxiety[  ];  Endocrine: diabetes[  ];  thyroid dysfunction[  ];  Immunizations: Flu up to date [  ]; Pneumococcal up to date [  ];  Other:  Physical Exam: BP 132/83 (BP Location: Left Arm, Patient Position: Sitting, Cuff Size: Small)   Pulse 90   Resp 20   Ht '5\' 8"'$  (1.727 m)   Wt 175 lb (79.4 kg)   SpO2 95% Comment: RA  BMI 26.61 kg/m   PHYSICAL EXAMINATION: General appearance: alert, cooperative, appears older than stated age and no distress Head: Normocephalic, without obvious abnormality, atraumatic Neck: no adenopathy, no carotid bruit, no JVD, supple, symmetrical, trachea midline,  no tenderness/mass/nodules and Evidence of previous left radical neck, without palpable nodes Lymph nodes: no enlarged Cervical, supraclavicular, and axillary nodes . Resp: diminished breath sounds bilaterally Back: symmetric, no curvature. ROM normal. No CVA tenderness. Cardio: regular rate and rhythm, S1, S2 normal, no murmur, click, rub or gallop GI: soft, non-tender; bowel sounds normal; no masses,  no organomegaly Extremities: extremities normal, atraumatic, no cyanosis or edema and Homans sign is  negative, no sign of DVT Neurologic: Grossly normal  Diagnostic Studies & Laboratory data:     Recent Radiology Findings:  Ct Angio Chest Aorta W &/or Wo Contrast  Result Date: 09/26/2015 CLINICAL DATA:  Aortic dissection.  History of lung cancer. EXAM: CT ANGIOGRAPHY CHEST WITH CONTRAST TECHNIQUE: Multidetector CT imaging of the chest was performed using the standard protocol during bolus administration of intravenous contrast. Multiplanar CT image reconstructions and MIPs were obtained to evaluate the vascular anatomy. CONTRAST:  75 cc Isovue 370 COMPARISON:  03/14/2015 FINDINGS: Creatinine was obtained on site at [facility] at [address] Results: Creatinine 0.8 mg/dL. Cardiovascular: Precontrast imaging shows no hyper attenuating crescent in the thoracic aorta to suggest acute intramural hematoma. Dissection flap again noted in the ascending thoracic aorta in this patient status post aortic valve replacement.  Measuring at the same level as on the prior study, the ascending thoracic aortic aneurysm measures 5.3 cm maximum diameter (image 17 series 2). No large central pulmonary embolus. Mediastinum/Nodes: No mediastinal lymphadenopathy. There is no hilar lymphadenopathy. Postsurgical and post radiation changes in the right hilum appears stable. The large hiatal hernia with organoaxial volvulus of the stomach is unchanged. No evidence for gastric outlet obstruction. Lungs/Pleura: Advanced changes of emphysema again noted with scattered areas of scarring and architectural distortion. 1.3 x 1.8 cm nodule in the posterior right upper lung has progressed in the interval measuring 2.2 x 3.4 cm today. This is associated with a new 1.5 x 3.0 cm nodule along its cranial margin (compare sagittal image 155 series 604 today to sagittal image 58 series 602 previously). The parahilar fibrosis is stable para 8 mm nodule posterior left costophrenic sulcus is unchanged. 8 mm subpleural nodule left lower  lobe (image 82 series 5) is stable. Upper Abdomen: Thickening of both adrenal glands is stable. Sequelae of chronic pancreatitis noted. Musculoskeletal: Bone windows reveal no worrisome lytic or sclerotic osseous lesions. Review of the MIP images confirms the above findings. IMPRESSION: Interval progression of irregular soft tissue attenuation in the posterior right lung apex. Recurrent disease is a concern. PET-CT may prove helpful to further evaluate. Stable appearance of ascending thoracic aortic aneurysm/dissection in this patient status post aortic valve replacement. Advanced emphysema. Electronically Signed   By: Misty Stanley M.D.   On: 09/26/2015 12:54  Ct Angio Chest Aorta W/cm &/or Wo/cm  03/14/2015  CLINICAL DATA:  Follow-up aortic dissection. History of right lung cancer in 2009, status post surgery, with recurrence to left supraclavicular port and right hilum, status post chemotherapy and radiation. Shortness of breath, cough. EXAM: CT ANGIOGRAPHY CHEST WITH CONTRAST TECHNIQUE: Multidetector CT imaging of the chest was performed using the standard protocol during bolus administration of intravenous contrast. Multiplanar CT image reconstructions and MIPs were obtained to evaluate the vascular anatomy. CONTRAST:  75 mL Isovue 370 IV COMPARISON:  CT chest dated 09/12/2014 FINDINGS: Prosthetic aortic valve. No evidence of intramural hematoma on unenhanced CT. Stable ascending thoracic aortic dissection arising just distal to the prosthetic valve (series 4/ image 64) and extending to the mid arch (series 4/image 39). Although not tailored for evaluation of the pulmonary arteries, there is no evidence of pulmonary embolism. Ascending thoracic aortic aneurysm, measuring 5.3 cm at the level of the proximal descending thoracic aorta (series 2/image 16), previously 5.1 cm when measured in a similar fashion. Mediastinum/Nodes: Heart is normal in size. No evidence or pericardial effusion. Coronary  atherosclerosis. Small mediastinal lymph nodes which do not meet pathologic CT size criteria. 14 mm calcified right perihilar node (series 2/ image 19), unchanged. Visualized thyroid is unremarkable. Lungs/Pleura: 1.3 x 1.8 cm nodule in the posterior right upper lobe (series 5/ image 17), previously 1.3 x 2.0 cm. Surrounding radiation changes. Additional radiation changes in the right perihilar region (series 5/ image 29). Stable 8 mm subpleural nodule in the left lower lobe (series 5/ image 39). Mild lingular scarring (series 5/image 50). Mild compressive atelectasis in the medial right lower lobe. Underlying moderate centrilobular and paraseptal emphysematous changes. No pleural effusion or pneumothorax. Upper abdomen: Visualized upper abdomen is notable for calcified hepatic and splenic granulomata, vascular calcifications, a large hiatal hernia/intrathoracic stomach, an parenchymal pancreatic calcifications (suggesting sequela of prior/chronic pancreatitis). Musculoskeletal: Degenerative changes of the visualized thoracolumbar spine. Median sternotomy. Review of the MIP images confirms the above findings. IMPRESSION: Status post aortic  valve replacement. Stable ascending thoracic aortic dissection. Mild progression of associated ascending thoracic aortic aneurysm, now measuring 5.3 cm, previously 5.1 cm. 1.3 x 1.8 cm nodule in the posterior right upper lobe, mildly decreased. Associated radiation changes. Additional radiation changes in the right perihilar region. Additional stable ancillary findings as above. Electronically Signed   By: Julian Hy M.D.   On: 03/14/2015 15:18      CT scan done at [facility] dated September 12 2014 is reviewed showing a ascending aortic dissection with a false  lumen extending from the proximal ascending aorta to the mid arch without propagation into the branch vessels, the right upper lobe lung nodule has decreased in size, there is evidence of severe emphysema and  radiation fibrosis in the right perihilar region.    I have independently reviewed the above radiology studies  and reviewed the findings with the patient.   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 This raises a difficult surgical dilemma is typically this is treated with emergency surgical therapy when acute. The patient has a very poor operative candidate from a functional and respiratory standpoint. I have again discussed with the patient frankly the risks and options involved with treatment versus nonsurgical treatment. After this discussion he prefers not to proceed with any surgical intervention which I completely agree with and have encouraged him to be diligent about blood pressure control.  Interval progression of irregular soft tissue attenuation in the posterior right lung apex. Recurrent disease is a concern. Plan  PET-CT to further evaluate Plan to see me back after PET scan is completed we'll also make a follow-up appointment for the patient to see Dr. Lavera Guise after the PET scan.  Grace Isaac MD      [address] [place] [address] Office [phone]   Beeper [phone]  09/26/2015 1:52 PM

## 2015-10-04 DIAGNOSIS — I739 Peripheral vascular disease, unspecified: Secondary | ICD-10-CM | POA: Insufficient documentation

## 2015-10-04 DIAGNOSIS — I714 Abdominal aortic aneurysm, without rupture, unspecified: Secondary | ICD-10-CM | POA: Insufficient documentation

## 2015-10-04 HISTORY — DX: Abdominal aortic aneurysm, without rupture: I71.4

## 2015-10-04 HISTORY — DX: Peripheral vascular disease, unspecified: I73.9

## 2015-10-04 HISTORY — DX: Abdominal aortic aneurysm, without rupture, unspecified: I71.40

## 2015-10-07 DIAGNOSIS — E782 Mixed hyperlipidemia: Secondary | ICD-10-CM | POA: Diagnosis not present

## 2015-10-07 DIAGNOSIS — I10 Essential (primary) hypertension: Secondary | ICD-10-CM | POA: Diagnosis not present

## 2015-10-07 DIAGNOSIS — E1165 Type 2 diabetes mellitus with hyperglycemia: Secondary | ICD-10-CM | POA: Diagnosis not present

## 2015-10-09 DIAGNOSIS — C3490 Malignant neoplasm of unspecified part of unspecified bronchus or lung: Secondary | ICD-10-CM | POA: Diagnosis not present

## 2015-10-09 DIAGNOSIS — C3411 Malignant neoplasm of upper lobe, right bronchus or lung: Secondary | ICD-10-CM | POA: Diagnosis not present

## 2015-10-17 ENCOUNTER — Encounter: Payer: Self-pay | Admitting: Cardiothoracic Surgery

## 2015-10-17 ENCOUNTER — Ambulatory Visit (INDEPENDENT_AMBULATORY_CARE_PROVIDER_SITE_OTHER): Payer: Medicare Other | Admitting: Cardiothoracic Surgery

## 2015-10-17 VITALS — BP 140/92 | HR 100 | Resp 20 | Ht 68.0 in | Wt 170.0 lb

## 2015-10-17 DIAGNOSIS — R918 Other nonspecific abnormal finding of lung field: Secondary | ICD-10-CM

## 2015-10-17 NOTE — Progress Notes (Signed)
Tommy [name]       [place] [address]             [phone]                    [name] [place] Medical Record #[id number] Date of Birth: February 27, 1941  Referring: Gery Pray, MD Primary Care: [name], MD  Chief Complaint:    No chief complaint on file.   History of Present Illness:    Tommy [name] 75 y.o. male is seen in the office  today for follow-up CTA of the chest . In August 2016 I saw the patient at the urgent request  of radiation oncology because of the finding of a ascending aortic dissection on CT scan of the chest done in [facility] July 13. At the time of the initial diagnosis of acute aortic dissection the patient was not seen by cardiac surgery.  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 The patient is also had multiple cancers including wedge resection of the right lower lobe and [facility] in 2008. A radical left radical neck dissection for squamous cell carcinoma. IN 2011 he had recurrence of squamous cell carcinoma in a right hilar nodes. 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 Patient has very poor lung function, on home oxygen at night and with very limited pulmonary reserve.  There was no evidence of dissection in January 2016  CT scan of the chest The patient denies any episodes of chest pain.  The patient is asymptomatic from.  He does have shortness of breath but denies chest pain .   Current Activity/ Functional Status:  Patient is independent with mobility/ambulation, transfers, ADL's, IADL's.   Zubrod Score: At the time of surgery this patient's most appropriate activity status/level should be described as: '[]'$     0    Normal activity, no symptoms '[]'$     1    Restricted in  physical strenuous activity but ambulatory, able to do out light work '[x]'$     2    Ambulatory and capable of self care, unable to do work activities, up and about               >50 % of waking hours                              '[]'$     3    Only limited self care, in bed greater than 50% of waking hours '[]'$     4    Completely disabled, no self care, confined to bed or chair '[]'$     5    Moribund   Past Medical History:  Diagnosis Date  . Aneurysm ([place])   . Cancer of neck ([place])   . Emphysema   . Hypertension   . 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 Radiation 04/17/14, 04/19/14, 04/23/14, 04/25/14, 04/27/14   SBRT to right upper lobe 55 gray  . Squamous cell lung cancer ([place])    right upper lobe    Past Surgical History:  Procedure Laterality Date  . CARDIAC VALVE SURGERY     01/28/2009 AVR with pericardial tissue valve  . Partial RLL Resection  [facility] March 09'  . TONSILECTOMY/ADENOIDECTOMY WITH MYRINGOTOMY    . TUMOR REMOVAL     left neck 2007    Family History  Problem Relation Age of Onset  . Emphysema Brother     smoked    Social History   Social History  . Marital status: Married    Spouse name: N/A  . Number of children: 1  . Years of education: N/A   Occupational History  . Retired Retired   Social History Main Topics  . Smoking status: Former Smoker    Packs/day: 2.00    Years: 45.00    Types: Cigarettes    Quit date: 03/02/2000  . Smokeless tobacco: Current User    Types: Chew  . Alcohol use No  . Drug use: No  . Sexual activity: Not on file   Other Topics Concern  . Not on file   Social History Narrative  . No narrative on file    History  Smoking Status  . Former Smoker  . Packs/day: 2.00  . Years: 45.00  . Types: Cigarettes  . Quit date: 03/02/2000  Smokeless Tobacco  . Current User  . Types: Chew    History  Alcohol Use No     No Known Allergies  Current Outpatient Prescriptions  Medication Sig Dispense Refill  . albuterol (PROAIR HFA) 108 (90  BASE) MCG/ACT inhaler Inhale 2 puffs into the lungs every 6 (six) hours as needed for wheezing.    Marland Kitchen albuterol (PROVENTIL) (2.5 MG/3ML) 0.083% nebulizer solution Take 2.5 mg by nebulization every 6 (six) hours as needed for wheezing or shortness of breath.    Marland Kitchen aspirin 325 MG tablet Take 325 mg by mouth daily.    . budesonide (PULMICORT) 0.5 MG/2ML nebulizer solution Take 0.5 mg by nebulization 2 (two) times daily.    . clonazePAM (KLONOPIN) 1 MG tablet Take 1 mg by mouth 2 (two) times daily as needed for anxiety.    . digoxin (LANOXIN) 0.125 MG tablet Take 0.125 mg by mouth daily.    Marland Kitchen ezetimibe (ZETIA) 10 MG tablet Take 10 mg by mouth daily.    . formoterol (PERFOROMIST) 20 MCG/2ML nebulizer solution Take 20 mcg by nebulization 2 (two) times daily.    Marland Kitchen losartan-hydrochlorothiazide (HYZAAR) 50-12.5 MG per tablet Take 1 tablet by mouth daily as needed.    . metoprolol tartrate (LOPRESSOR) 25 MG tablet 1/2 tablet twice per day    . nitroGLYCERIN (NITROSTAT) 0.4 MG SL tablet Place 0.4 mg under the tongue every 5 (five) minutes as needed for chest pain.    . Omega-3 Fatty Acids (FISH OIL) 1000 MG CAPS Take 2 capsules by mouth daily.    . vitamin E 400 UNIT capsule Take 400 Units by mouth daily.     No current facility-administered medications for this visit.       Review of Systems:     Cardiac Review of Systems: Y or N  Chest Pain [  n  ]  Resting SOB [  y ] Exertional SOB  Blue.Reese  ]  Orthopnea Blue.Reese  ]   Pedal Edema Blue.Reese   ]    Palpitations [ n ] Syncope  [ n ]   Presyncope [n   ]  General Review of Systems: [Y] = yes [  ]=no Constitional: recent weight change [  ];  Wt loss over the last 3 months [   ] anorexia [  ]; fatigue Blue.Reese  ]; nausea [  ];  night sweats [  ]; fever [  ]; or chills [  ];          Dental: poor dentition[  ]; Last Dentist visit:   Eye : blurred vision [  ]; diplopia [   ]; vision changes [  ];  Amaurosis fugax[  ]; Resp: cough [  ];  wheezing[  ];  hemoptysis[  ]; shortness of  breath[  ]; paroxysmal nocturnal dyspnea[ y ]; dyspnea on exertion[ y ]; or orthopnea[  ];  GI:  gallstones[  ], vomiting[  ];  dysphagia[  ]; melena[  ];  hematochezia [  ]; heartburn[  ];   Hx of  Colonoscopy[  ]; GU: kidney stones [  ]; hematuria[  ];   dysuria [  ];  nocturia[  ];  history of     obstruction [  ]; urinary frequency [  ]             Skin: rash, swelling[  ];, hair loss[  ];  peripheral edema[  ];  or itching[  ]; Musculosketetal: myalgias[  ];  joint swelling[  ];  joint erythema[  ];  joint pain[  ];  back pain[  y];  Heme/Lymph: bruising[  ];  bleeding[  ];  anemia[  ];  Neuro: TIA[  ];  headaches[  ];  stroke[n  ];  vertigo[n  ];  seizures[n  ];   paresthesias[  ];  difficulty walking[y  ];  Psych:depression[  ]; anxiety[  ];  Endocrine: diabetes[  ];  thyroid dysfunction[  ];  Immunizations: Flu up to date [  ]; Pneumococcal up to date [  ];  Other:  Physical Exam: There were no vitals taken for this visit.  PHYSICAL EXAMINATION: General appearance: alert, cooperative, appears older than stated age and no distress Head: Normocephalic, without obvious abnormality, atraumatic Neck: no adenopathy, no carotid bruit, no JVD, supple, symmetrical, trachea midline,  no tenderness/mass/nodules and Evidence of previous left radical neck, without palpable nodes Lymph nodes: no enlarged Cervical, supraclavicular, and axillary nodes . Resp: diminished breath sounds bilaterally Back: symmetric, no curvature. ROM normal. No CVA tenderness. Cardio: regular rate and rhythm, S1, S2 normal, no murmur, click, rub or gallop GI: soft, non-tender; bowel sounds normal; no masses,  no organomegaly Extremities: extremities normal, atraumatic, no cyanosis or edema and Homans sign is negative, no sign of DVT Neurologic: Grossly normal  Diagnostic Studies & Laboratory data:     Recent Radiology Findings:  CLINICAL DATA: Subsequent treatment strategy for right lung cancer with increased  nodular opacity at the treatment site in the posterior right upper lobe on recent chest CT. Patient completed radiation treatment in the right upper lobe in February 2016.  EXAM: NUCLEAR MEDICINE PET SKULL BASE TO THIGH  TECHNIQUE: 12.6 mCi F-18 FDG was injected intravenously. Full-ring PET imaging was performed from the skull base to thigh after the radiotracer. CT data was obtained and used for attenuation correction and anatomic localization.  FASTING BLOOD GLUCOSE: Value: 91 mg/dl  COMPARISON: 09/26/2015 chest CT angiogram. 03/07/2014 PET-CT.  FINDINGS: NECK  No hypermetabolic lymph nodes in the neck. Surgical clips are again noted throughout the left neck. Stable mucous retention cyst versus polyp in left maxillary sinus.  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  There is stable sharply marginated radiation fibrosis in the right parahilar lung. Prior superior segment right lower lobe wedge resection.  Subpleural 0.7 cm left lower lobe pulmonary nodule (series 3/image 166), below PET resolution, stable since 2011 and considered benign. No acute consolidative airspace disease or additional significant pulmonary nodules.  Superficial subcutaneous 1.0 cm soft tissue density lesion in the right axilla (series 3/image 98) with low level metabolism with max SUV 2.9, not definitely seen on 03/07/2014 PET-CT.  No hypermetabolic axillary, mediastinal or hilar nodes. Aortic atherosclerosis. Coronary atherosclerosis. Aortic valve prosthesis is in place. Stable ascending thoracic aortic 5.5 cm aneurysm. No pericardial or pleural effusions. Mild-to-moderate  centrilobular emphysema with diffuse bronchial wall thickening.  ABDOMEN/PELVIS  There is focal hypermetabolism within the medial ascending colon with the suggestion of associated colonic wall thickening (series 3/image 261) with max SUV 10.0 (max SUV 9.3 on prior PET CT).  No abnormal hypermetabolic activity within the liver, pancreas, adrenal glands, or spleen. No hypermetabolic lymph nodes in the abdomen or pelvis. Large hiatal hernia. Punctate calcifications throughout the pancreas could represent chronic pancreatitis and/or vascular calcifications. No discrete adrenal nodules. Simple interpolar 2.5 cm right renal cyst. Stable enlarged prostate with nonspecific internal prostatic calcifications. Small to moderate fat containing right inguinal hernia, increased. Stable small to moderate fat containing umbilical hernia. Status post aortobi-iliac stent graft placement for infrarenal abdominal aortic aneurysm measuring 4.8 cm, previously 4.6 cm, slightly increased.  SKELETON  No focal hypermetabolic activity to suggest skeletal metastasis.  IMPRESSION: 1. PET-CT findings are most consistent with evolving masslike radiation fibrosis the posterior right upper lobe of the lung. No specific evidence of local tumor recurrence in the right lung. Continued chest CT surveillance is recommended. 2. No evidence of hypermetabolic metastatic disease. 3. Focal hypermetabolism within the medial ascending colon with the suggestion of associated colonic wall thickening on the CT images, cannot exclude a primary colonic neoplasm in this location. Recommend correlation with colonoscopy if not recently performed. 4. Superficial subcutaneous 1.0 cm soft tissue density nodule in the right axilla with associated low level metabolism, nonspecific, favor an inflammatory lesion such as a sebaceous cyst. Recommend correlation with clinical exam. 5. Infrarenal abdominal aortic aneurysm status post aorta  bi-iliac stent graft placement. Slight increased size of the infrarenal abdominal aortic aneurysm sac, cannot exclude an underlying endoleak. Vascular surgery consultation advised. 6. Stable 5.5 cm ascending thoracic aortic aneurysm. 7. Additional findings include aortic atherosclerosis, large hiatal hernia, enlarged prostate comment increased size of small to moderate fat containing right inguinal hernia, and stable small to moderate fat containing umbilical hernia.   Electronically Signed By: Ilona Sorrel M.D. On: 10/09/2015 11:48      Ct Angio Chest Aorta W &/or Wo Contrast  Result Date: 09/26/2015 CLINICAL DATA:  Aortic dissection.  History of lung cancer. EXAM: CT ANGIOGRAPHY CHEST WITH CONTRAST TECHNIQUE: Multidetector CT imaging of the chest was performed using the standard protocol during bolus administration of intravenous contrast. Multiplanar CT image reconstructions and MIPs were obtained to evaluate the vascular anatomy. CONTRAST:  75 cc Isovue 370 COMPARISON:  03/14/2015 FINDINGS: Creatinine was obtained on site at [facility] at [address] Results: Creatinine 0.8 mg/dL. Cardiovascular: Precontrast imaging shows no hyper attenuating crescent in the thoracic aorta to suggest acute intramural hematoma. Dissection flap again noted in the ascending thoracic aorta in this patient status post aortic valve replacement. Measuring at  the same level as on the prior study, the ascending thoracic aortic aneurysm measures 5.3 cm maximum diameter (image 17 series 2). No large central pulmonary embolus. Mediastinum/Nodes: No mediastinal lymphadenopathy. There is no hilar lymphadenopathy. Postsurgical and post radiation changes in the right hilum appears stable. The large hiatal hernia with organoaxial volvulus of the stomach is unchanged. No evidence for gastric outlet obstruction. Lungs/Pleura: Advanced changes of emphysema again noted with scattered areas of scarring and  architectural distortion. 1.3 x 1.8 cm nodule in the posterior right upper lung has progressed in the interval measuring 2.2 x 3.4 cm today. This is associated with a new 1.5 x 3.0 cm nodule along its cranial margin (compare sagittal image 155 series 604 today to sagittal image 58 series 602 previously). The parahilar fibrosis is stable para 8 mm nodule posterior left costophrenic sulcus is unchanged. 8 mm subpleural nodule left lower lobe (image 82 series 5) is stable. Upper Abdomen: Thickening of both adrenal glands is stable. Sequelae of chronic pancreatitis noted. Musculoskeletal: Bone windows reveal no worrisome lytic or sclerotic osseous lesions. Review of the MIP images confirms the above findings. IMPRESSION: Interval progression of irregular soft tissue attenuation in the posterior right lung apex. Recurrent disease is a concern. PET-CT may prove helpful to further evaluate. Stable appearance of ascending thoracic aortic aneurysm/dissection in this patient status post aortic valve replacement. Advanced emphysema. Electronically Signed   By: Misty Stanley M.D.   On: 09/26/2015 12:54  Ct Angio Chest Aorta W/cm &/or Wo/cm  03/14/2015  CLINICAL DATA:  Follow-up aortic dissection. History of right lung cancer in 2009, status post surgery, with recurrence to left supraclavicular port and right hilum, status post chemotherapy and radiation. Shortness of breath, cough. EXAM: CT ANGIOGRAPHY CHEST WITH CONTRAST TECHNIQUE: Multidetector CT imaging of the chest was performed using the standard protocol during bolus administration of intravenous contrast. Multiplanar CT image reconstructions and MIPs were obtained to evaluate the vascular anatomy. CONTRAST:  75 mL Isovue 370 IV COMPARISON:  CT chest dated 09/12/2014 FINDINGS: Prosthetic aortic valve. No evidence of intramural hematoma on unenhanced CT. Stable ascending thoracic aortic dissection arising just distal to the prosthetic valve (series 4/ image 64) and  extending to the mid arch (series 4/image 39). Although not tailored for evaluation of the pulmonary arteries, there is no evidence of pulmonary embolism. Ascending thoracic aortic aneurysm, measuring 5.3 cm at the level of the proximal descending thoracic aorta (series 2/image 16), previously 5.1 cm when measured in a similar fashion. Mediastinum/Nodes: Heart is normal in size. No evidence or pericardial effusion. Coronary atherosclerosis. Small mediastinal lymph nodes which do not meet pathologic CT size criteria. 14 mm calcified right perihilar node (series 2/ image 19), unchanged. Visualized thyroid is unremarkable. Lungs/Pleura: 1.3 x 1.8 cm nodule in the posterior right upper lobe (series 5/ image 17), previously 1.3 x 2.0 cm. Surrounding radiation changes. Additional radiation changes in the right perihilar region (series 5/ image 29). Stable 8 mm subpleural nodule in the left lower lobe (series 5/ image 39). Mild lingular scarring (series 5/image 50). Mild compressive atelectasis in the medial right lower lobe. Underlying moderate centrilobular and paraseptal emphysematous changes. No pleural effusion or pneumothorax. Upper abdomen: Visualized upper abdomen is notable for calcified hepatic and splenic granulomata, vascular calcifications, a large hiatal hernia/intrathoracic stomach, an parenchymal pancreatic calcifications (suggesting sequela of prior/chronic pancreatitis). Musculoskeletal: Degenerative changes of the visualized thoracolumbar spine. Median sternotomy. Review of the MIP images confirms the above findings. IMPRESSION: Status post aortic valve replacement.  Stable ascending thoracic aortic dissection. Mild progression of associated ascending thoracic aortic aneurysm, now measuring 5.3 cm, previously 5.1 cm. 1.3 x 1.8 cm nodule in the posterior right upper lobe, mildly decreased. Associated radiation changes. Additional radiation changes in the right perihilar region. Additional stable ancillary  findings as above. Electronically Signed   By: Julian Hy M.D.   On: 03/14/2015 15:18      [facility] done [facility] dated September 12 2014 is reviewed showing a ascending aortic dissection with a false  lumen extending from the proximal ascending aorta to the mid arch without propagation into the branch vessels, the right upper lobe lung nodule has decreased in size, there is evidence of severe emphysema and radiation fibrosis in the right perihilar region.    I have independently reviewed the above radiology studies  and reviewed the findings with the patient.   Recent Lab Findings: Lab Results  Component Value Date   WBC 8.1 06/04/2009   HGB 14.1 06/04/2009   HCT 41.7 06/04/2009   PLT 131 (L) 06/04/2009   GLUCOSE 152 (H) 06/04/2009   ALT 16 06/04/2009   AST 20 06/04/2009   NA 140 06/04/2009   K 4.3 06/04/2009   CL 105 06/04/2009   CREATININE 0.90 06/04/2009   BUN 5 (L) 06/04/2009   CO2 32 06/04/2009   INR 1.06 06/04/2009      Assessment / Plan:    Interval progression of irregular soft tissue attenuation in the posterior right lung apex. Recurrent disease is a concern.   PET-CT findings are most consistent with evolving masslike radiation fibrosis the posterior right upper lobe of the lung. No specific evidence of local tumor recurrence in the right lung. Continued chest CT surveillance is recommended. Patient has an appointment to see Dr. Lavera Guise next week. I recommended that we obtain a follow-up scan approximate 4 months. He would prefer to continue to see Dr. [name] so he does not have to drive back and forth. He will discuss this with Dr. [name] next week.  Grace Isaac MD      [name] [place] [address] Office [phone]   Beeper [phone]  10/17/2015 1:46 PM

## 2015-10-28 DIAGNOSIS — Z952 Presence of prosthetic heart valve: Secondary | ICD-10-CM | POA: Diagnosis not present

## 2015-10-28 DIAGNOSIS — I10 Essential (primary) hypertension: Secondary | ICD-10-CM | POA: Diagnosis not present

## 2015-10-28 DIAGNOSIS — I7101 Dissection of thoracic aorta: Secondary | ICD-10-CM | POA: Diagnosis not present

## 2015-10-29 DIAGNOSIS — C3431 Malignant neoplasm of lower lobe, right bronchus or lung: Secondary | ICD-10-CM | POA: Diagnosis not present

## 2015-10-29 DIAGNOSIS — Z85118 Personal history of other malignant neoplasm of bronchus and lung: Secondary | ICD-10-CM | POA: Diagnosis not present

## 2015-11-19 DIAGNOSIS — R933 Abnormal findings on diagnostic imaging of other parts of digestive tract: Secondary | ICD-10-CM | POA: Diagnosis not present

## 2015-11-19 DIAGNOSIS — R1310 Dysphagia, unspecified: Secondary | ICD-10-CM | POA: Diagnosis not present

## 2015-11-29 DIAGNOSIS — D122 Benign neoplasm of ascending colon: Secondary | ICD-10-CM | POA: Diagnosis not present

## 2015-11-29 DIAGNOSIS — D124 Benign neoplasm of descending colon: Secondary | ICD-10-CM | POA: Diagnosis not present

## 2015-11-29 DIAGNOSIS — D128 Benign neoplasm of rectum: Secondary | ICD-10-CM | POA: Diagnosis not present

## 2015-11-29 DIAGNOSIS — R933 Abnormal findings on diagnostic imaging of other parts of digestive tract: Secondary | ICD-10-CM | POA: Diagnosis not present

## 2015-11-29 DIAGNOSIS — D123 Benign neoplasm of transverse colon: Secondary | ICD-10-CM | POA: Diagnosis not present

## 2015-11-29 DIAGNOSIS — Z800 Family history of malignant neoplasm of digestive organs: Secondary | ICD-10-CM | POA: Diagnosis not present

## 2015-12-05 DIAGNOSIS — K6389 Other specified diseases of intestine: Secondary | ICD-10-CM | POA: Insufficient documentation

## 2015-12-05 DIAGNOSIS — K639 Disease of intestine, unspecified: Secondary | ICD-10-CM | POA: Diagnosis not present

## 2015-12-05 DIAGNOSIS — C189 Malignant neoplasm of colon, unspecified: Secondary | ICD-10-CM | POA: Diagnosis not present

## 2015-12-12 DIAGNOSIS — K429 Umbilical hernia without obstruction or gangrene: Secondary | ICD-10-CM | POA: Diagnosis not present

## 2015-12-12 DIAGNOSIS — K449 Diaphragmatic hernia without obstruction or gangrene: Secondary | ICD-10-CM | POA: Diagnosis not present

## 2015-12-12 DIAGNOSIS — K4090 Unilateral inguinal hernia, without obstruction or gangrene, not specified as recurrent: Secondary | ICD-10-CM | POA: Diagnosis not present

## 2015-12-12 DIAGNOSIS — N400 Enlarged prostate without lower urinary tract symptoms: Secondary | ICD-10-CM | POA: Diagnosis not present

## 2015-12-12 DIAGNOSIS — C3490 Malignant neoplasm of unspecified part of unspecified bronchus or lung: Secondary | ICD-10-CM | POA: Diagnosis not present

## 2015-12-12 DIAGNOSIS — C189 Malignant neoplasm of colon, unspecified: Secondary | ICD-10-CM | POA: Diagnosis not present

## 2015-12-16 DIAGNOSIS — C3401 Malignant neoplasm of right main bronchus: Secondary | ICD-10-CM | POA: Diagnosis not present

## 2015-12-16 DIAGNOSIS — E559 Vitamin D deficiency, unspecified: Secondary | ICD-10-CM | POA: Diagnosis not present

## 2015-12-16 DIAGNOSIS — R5383 Other fatigue: Secondary | ICD-10-CM | POA: Diagnosis not present

## 2015-12-16 DIAGNOSIS — J449 Chronic obstructive pulmonary disease, unspecified: Secondary | ICD-10-CM | POA: Diagnosis not present

## 2015-12-19 DIAGNOSIS — Z23 Encounter for immunization: Secondary | ICD-10-CM | POA: Diagnosis not present

## 2015-12-23 DIAGNOSIS — J449 Chronic obstructive pulmonary disease, unspecified: Secondary | ICD-10-CM | POA: Diagnosis not present

## 2015-12-25 DIAGNOSIS — Z952 Presence of prosthetic heart valve: Secondary | ICD-10-CM | POA: Diagnosis not present

## 2015-12-25 DIAGNOSIS — Z01810 Encounter for preprocedural cardiovascular examination: Secondary | ICD-10-CM | POA: Diagnosis not present

## 2015-12-25 DIAGNOSIS — E782 Mixed hyperlipidemia: Secondary | ICD-10-CM | POA: Diagnosis not present

## 2015-12-25 DIAGNOSIS — Z01818 Encounter for other preprocedural examination: Secondary | ICD-10-CM | POA: Diagnosis not present

## 2015-12-25 DIAGNOSIS — I7101 Dissection of thoracic aorta: Secondary | ICD-10-CM | POA: Diagnosis not present

## 2015-12-25 DIAGNOSIS — I10 Essential (primary) hypertension: Secondary | ICD-10-CM | POA: Diagnosis not present

## 2015-12-26 DIAGNOSIS — C3490 Malignant neoplasm of unspecified part of unspecified bronchus or lung: Secondary | ICD-10-CM | POA: Diagnosis not present

## 2015-12-26 DIAGNOSIS — R5383 Other fatigue: Secondary | ICD-10-CM | POA: Diagnosis not present

## 2015-12-26 DIAGNOSIS — J449 Chronic obstructive pulmonary disease, unspecified: Secondary | ICD-10-CM | POA: Diagnosis not present

## 2015-12-31 DIAGNOSIS — E782 Mixed hyperlipidemia: Secondary | ICD-10-CM | POA: Diagnosis not present

## 2015-12-31 DIAGNOSIS — I10 Essential (primary) hypertension: Secondary | ICD-10-CM | POA: Diagnosis not present

## 2015-12-31 DIAGNOSIS — I7101 Dissection of thoracic aorta: Secondary | ICD-10-CM | POA: Diagnosis not present

## 2015-12-31 DIAGNOSIS — Z952 Presence of prosthetic heart valve: Secondary | ICD-10-CM | POA: Diagnosis not present

## 2015-12-31 DIAGNOSIS — Z01810 Encounter for preprocedural cardiovascular examination: Secondary | ICD-10-CM | POA: Diagnosis not present

## 2015-12-31 DIAGNOSIS — Z01818 Encounter for other preprocedural examination: Secondary | ICD-10-CM | POA: Diagnosis not present

## 2016-01-08 DIAGNOSIS — K639 Disease of intestine, unspecified: Secondary | ICD-10-CM | POA: Diagnosis not present

## 2016-01-27 DIAGNOSIS — J441 Chronic obstructive pulmonary disease with (acute) exacerbation: Secondary | ICD-10-CM | POA: Diagnosis not present

## 2016-01-27 DIAGNOSIS — Z79899 Other long term (current) drug therapy: Secondary | ICD-10-CM | POA: Diagnosis not present

## 2016-01-27 DIAGNOSIS — I719 Aortic aneurysm of unspecified site, without rupture: Secondary | ICD-10-CM | POA: Diagnosis not present

## 2016-01-27 DIAGNOSIS — I739 Peripheral vascular disease, unspecified: Secondary | ICD-10-CM | POA: Diagnosis present

## 2016-01-27 DIAGNOSIS — Z9981 Dependence on supplemental oxygen: Secondary | ICD-10-CM | POA: Diagnosis not present

## 2016-01-27 DIAGNOSIS — R000 Tachycardia, unspecified: Secondary | ICD-10-CM | POA: Diagnosis present

## 2016-01-27 DIAGNOSIS — D72829 Elevated white blood cell count, unspecified: Secondary | ICD-10-CM | POA: Diagnosis present

## 2016-01-27 DIAGNOSIS — I2510 Atherosclerotic heart disease of native coronary artery without angina pectoris: Secondary | ICD-10-CM | POA: Diagnosis present

## 2016-01-27 DIAGNOSIS — I10 Essential (primary) hypertension: Secondary | ICD-10-CM | POA: Diagnosis not present

## 2016-01-27 DIAGNOSIS — Z902 Acquired absence of lung [part of]: Secondary | ICD-10-CM | POA: Diagnosis not present

## 2016-01-27 DIAGNOSIS — D122 Benign neoplasm of ascending colon: Secondary | ICD-10-CM | POA: Diagnosis not present

## 2016-01-27 DIAGNOSIS — Z7982 Long term (current) use of aspirin: Secondary | ICD-10-CM | POA: Diagnosis not present

## 2016-01-27 DIAGNOSIS — E119 Type 2 diabetes mellitus without complications: Secondary | ICD-10-CM | POA: Diagnosis not present

## 2016-01-27 DIAGNOSIS — D126 Benign neoplasm of colon, unspecified: Secondary | ICD-10-CM | POA: Diagnosis not present

## 2016-01-27 DIAGNOSIS — Z85118 Personal history of other malignant neoplasm of bronchus and lung: Secondary | ICD-10-CM | POA: Diagnosis not present

## 2016-01-27 DIAGNOSIS — I714 Abdominal aortic aneurysm, without rupture: Secondary | ICD-10-CM | POA: Diagnosis present

## 2016-01-27 DIAGNOSIS — J449 Chronic obstructive pulmonary disease, unspecified: Secondary | ICD-10-CM | POA: Diagnosis not present

## 2016-01-27 DIAGNOSIS — D120 Benign neoplasm of cecum: Secondary | ICD-10-CM | POA: Diagnosis not present

## 2016-01-27 DIAGNOSIS — Z87891 Personal history of nicotine dependence: Secondary | ICD-10-CM | POA: Diagnosis not present

## 2016-01-27 DIAGNOSIS — C3491 Malignant neoplasm of unspecified part of right bronchus or lung: Secondary | ICD-10-CM | POA: Diagnosis not present

## 2016-01-27 DIAGNOSIS — E785 Hyperlipidemia, unspecified: Secondary | ICD-10-CM | POA: Diagnosis present

## 2016-01-27 DIAGNOSIS — J9611 Chronic respiratory failure with hypoxia: Secondary | ICD-10-CM | POA: Diagnosis present

## 2016-01-28 DIAGNOSIS — E119 Type 2 diabetes mellitus without complications: Secondary | ICD-10-CM

## 2016-01-28 DIAGNOSIS — I10 Essential (primary) hypertension: Secondary | ICD-10-CM

## 2016-01-28 DIAGNOSIS — J449 Chronic obstructive pulmonary disease, unspecified: Secondary | ICD-10-CM

## 2016-01-28 DIAGNOSIS — I719 Aortic aneurysm of unspecified site, without rupture: Secondary | ICD-10-CM

## 2016-01-29 DIAGNOSIS — E119 Type 2 diabetes mellitus without complications: Secondary | ICD-10-CM

## 2016-01-29 DIAGNOSIS — I719 Aortic aneurysm of unspecified site, without rupture: Secondary | ICD-10-CM

## 2016-01-29 DIAGNOSIS — J449 Chronic obstructive pulmonary disease, unspecified: Secondary | ICD-10-CM

## 2016-01-29 DIAGNOSIS — I10 Essential (primary) hypertension: Secondary | ICD-10-CM

## 2016-02-02 DIAGNOSIS — I10 Essential (primary) hypertension: Secondary | ICD-10-CM | POA: Diagnosis not present

## 2016-02-02 DIAGNOSIS — Z9049 Acquired absence of other specified parts of digestive tract: Secondary | ICD-10-CM | POA: Diagnosis not present

## 2016-02-02 DIAGNOSIS — M109 Gout, unspecified: Secondary | ICD-10-CM | POA: Diagnosis not present

## 2016-02-02 DIAGNOSIS — Z48815 Encounter for surgical aftercare following surgery on the digestive system: Secondary | ICD-10-CM | POA: Diagnosis not present

## 2016-02-02 DIAGNOSIS — C189 Malignant neoplasm of colon, unspecified: Secondary | ICD-10-CM | POA: Diagnosis not present

## 2016-02-02 DIAGNOSIS — J449 Chronic obstructive pulmonary disease, unspecified: Secondary | ICD-10-CM | POA: Diagnosis not present

## 2016-02-03 DIAGNOSIS — Z48815 Encounter for surgical aftercare following surgery on the digestive system: Secondary | ICD-10-CM | POA: Diagnosis not present

## 2016-02-03 DIAGNOSIS — I10 Essential (primary) hypertension: Secondary | ICD-10-CM | POA: Diagnosis not present

## 2016-02-03 DIAGNOSIS — M109 Gout, unspecified: Secondary | ICD-10-CM | POA: Diagnosis not present

## 2016-02-03 DIAGNOSIS — C189 Malignant neoplasm of colon, unspecified: Secondary | ICD-10-CM | POA: Diagnosis not present

## 2016-02-03 DIAGNOSIS — Z9049 Acquired absence of other specified parts of digestive tract: Secondary | ICD-10-CM | POA: Diagnosis not present

## 2016-02-03 DIAGNOSIS — J449 Chronic obstructive pulmonary disease, unspecified: Secondary | ICD-10-CM | POA: Diagnosis not present

## 2016-02-05 DIAGNOSIS — C189 Malignant neoplasm of colon, unspecified: Secondary | ICD-10-CM | POA: Diagnosis not present

## 2016-02-05 DIAGNOSIS — Z9049 Acquired absence of other specified parts of digestive tract: Secondary | ICD-10-CM | POA: Diagnosis not present

## 2016-02-05 DIAGNOSIS — C180 Malignant neoplasm of cecum: Secondary | ICD-10-CM | POA: Diagnosis not present

## 2016-02-05 DIAGNOSIS — J449 Chronic obstructive pulmonary disease, unspecified: Secondary | ICD-10-CM | POA: Diagnosis not present

## 2016-02-05 DIAGNOSIS — M109 Gout, unspecified: Secondary | ICD-10-CM | POA: Diagnosis not present

## 2016-02-05 DIAGNOSIS — I10 Essential (primary) hypertension: Secondary | ICD-10-CM | POA: Diagnosis not present

## 2016-02-05 DIAGNOSIS — Z48815 Encounter for surgical aftercare following surgery on the digestive system: Secondary | ICD-10-CM | POA: Diagnosis not present

## 2016-02-07 DIAGNOSIS — Z9049 Acquired absence of other specified parts of digestive tract: Secondary | ICD-10-CM | POA: Diagnosis not present

## 2016-02-07 DIAGNOSIS — C189 Malignant neoplasm of colon, unspecified: Secondary | ICD-10-CM | POA: Diagnosis not present

## 2016-02-07 DIAGNOSIS — J449 Chronic obstructive pulmonary disease, unspecified: Secondary | ICD-10-CM | POA: Diagnosis not present

## 2016-02-07 DIAGNOSIS — I10 Essential (primary) hypertension: Secondary | ICD-10-CM | POA: Diagnosis not present

## 2016-02-07 DIAGNOSIS — Z48815 Encounter for surgical aftercare following surgery on the digestive system: Secondary | ICD-10-CM | POA: Diagnosis not present

## 2016-02-07 DIAGNOSIS — M109 Gout, unspecified: Secondary | ICD-10-CM | POA: Diagnosis not present

## 2016-02-11 DIAGNOSIS — I10 Essential (primary) hypertension: Secondary | ICD-10-CM | POA: Diagnosis not present

## 2016-02-11 DIAGNOSIS — C189 Malignant neoplasm of colon, unspecified: Secondary | ICD-10-CM | POA: Diagnosis not present

## 2016-02-11 DIAGNOSIS — J449 Chronic obstructive pulmonary disease, unspecified: Secondary | ICD-10-CM | POA: Diagnosis not present

## 2016-02-11 DIAGNOSIS — Z9049 Acquired absence of other specified parts of digestive tract: Secondary | ICD-10-CM | POA: Diagnosis not present

## 2016-02-11 DIAGNOSIS — Z48815 Encounter for surgical aftercare following surgery on the digestive system: Secondary | ICD-10-CM | POA: Diagnosis not present

## 2016-02-11 DIAGNOSIS — M109 Gout, unspecified: Secondary | ICD-10-CM | POA: Diagnosis not present

## 2016-02-14 DIAGNOSIS — I10 Essential (primary) hypertension: Secondary | ICD-10-CM | POA: Diagnosis not present

## 2016-02-14 DIAGNOSIS — Z48815 Encounter for surgical aftercare following surgery on the digestive system: Secondary | ICD-10-CM | POA: Diagnosis not present

## 2016-02-14 DIAGNOSIS — Z9049 Acquired absence of other specified parts of digestive tract: Secondary | ICD-10-CM | POA: Diagnosis not present

## 2016-02-14 DIAGNOSIS — M109 Gout, unspecified: Secondary | ICD-10-CM | POA: Diagnosis not present

## 2016-02-14 DIAGNOSIS — J449 Chronic obstructive pulmonary disease, unspecified: Secondary | ICD-10-CM | POA: Diagnosis not present

## 2016-02-14 DIAGNOSIS — C189 Malignant neoplasm of colon, unspecified: Secondary | ICD-10-CM | POA: Diagnosis not present

## 2016-02-17 DIAGNOSIS — M109 Gout, unspecified: Secondary | ICD-10-CM | POA: Diagnosis not present

## 2016-02-17 DIAGNOSIS — I10 Essential (primary) hypertension: Secondary | ICD-10-CM | POA: Diagnosis not present

## 2016-02-17 DIAGNOSIS — Z9049 Acquired absence of other specified parts of digestive tract: Secondary | ICD-10-CM | POA: Diagnosis not present

## 2016-02-17 DIAGNOSIS — C189 Malignant neoplasm of colon, unspecified: Secondary | ICD-10-CM | POA: Diagnosis not present

## 2016-02-17 DIAGNOSIS — J449 Chronic obstructive pulmonary disease, unspecified: Secondary | ICD-10-CM | POA: Diagnosis not present

## 2016-02-17 DIAGNOSIS — Z48815 Encounter for surgical aftercare following surgery on the digestive system: Secondary | ICD-10-CM | POA: Diagnosis not present

## 2016-02-19 DIAGNOSIS — C189 Malignant neoplasm of colon, unspecified: Secondary | ICD-10-CM | POA: Diagnosis not present

## 2016-02-19 DIAGNOSIS — M109 Gout, unspecified: Secondary | ICD-10-CM | POA: Diagnosis not present

## 2016-02-19 DIAGNOSIS — J449 Chronic obstructive pulmonary disease, unspecified: Secondary | ICD-10-CM | POA: Diagnosis not present

## 2016-02-19 DIAGNOSIS — I10 Essential (primary) hypertension: Secondary | ICD-10-CM | POA: Diagnosis not present

## 2016-02-19 DIAGNOSIS — Z9049 Acquired absence of other specified parts of digestive tract: Secondary | ICD-10-CM | POA: Diagnosis not present

## 2016-02-19 DIAGNOSIS — Z48815 Encounter for surgical aftercare following surgery on the digestive system: Secondary | ICD-10-CM | POA: Diagnosis not present

## 2016-02-21 DIAGNOSIS — Z9049 Acquired absence of other specified parts of digestive tract: Secondary | ICD-10-CM | POA: Diagnosis not present

## 2016-02-21 DIAGNOSIS — I10 Essential (primary) hypertension: Secondary | ICD-10-CM | POA: Diagnosis not present

## 2016-02-21 DIAGNOSIS — C189 Malignant neoplasm of colon, unspecified: Secondary | ICD-10-CM | POA: Diagnosis not present

## 2016-02-21 DIAGNOSIS — J449 Chronic obstructive pulmonary disease, unspecified: Secondary | ICD-10-CM | POA: Diagnosis not present

## 2016-02-21 DIAGNOSIS — M109 Gout, unspecified: Secondary | ICD-10-CM | POA: Diagnosis not present

## 2016-02-21 DIAGNOSIS — Z48815 Encounter for surgical aftercare following surgery on the digestive system: Secondary | ICD-10-CM | POA: Diagnosis not present

## 2016-02-26 DIAGNOSIS — R5081 Fever presenting with conditions classified elsewhere: Secondary | ICD-10-CM | POA: Diagnosis not present

## 2016-02-26 DIAGNOSIS — J441 Chronic obstructive pulmonary disease with (acute) exacerbation: Secondary | ICD-10-CM | POA: Diagnosis not present

## 2016-02-27 DIAGNOSIS — J90 Pleural effusion, not elsewhere classified: Secondary | ICD-10-CM | POA: Diagnosis not present

## 2016-02-27 DIAGNOSIS — I7101 Dissection of thoracic aorta: Secondary | ICD-10-CM | POA: Diagnosis not present

## 2016-02-27 DIAGNOSIS — C3431 Malignant neoplasm of lower lobe, right bronchus or lung: Secondary | ICD-10-CM | POA: Diagnosis not present

## 2016-02-27 DIAGNOSIS — I700 Atherosclerosis of aorta: Secondary | ICD-10-CM | POA: Diagnosis not present

## 2016-02-27 DIAGNOSIS — J181 Lobar pneumonia, unspecified organism: Secondary | ICD-10-CM | POA: Diagnosis not present

## 2016-02-27 DIAGNOSIS — I2510 Atherosclerotic heart disease of native coronary artery without angina pectoris: Secondary | ICD-10-CM | POA: Diagnosis not present

## 2016-02-27 DIAGNOSIS — K861 Other chronic pancreatitis: Secondary | ICD-10-CM | POA: Diagnosis not present

## 2016-02-27 DIAGNOSIS — K449 Diaphragmatic hernia without obstruction or gangrene: Secondary | ICD-10-CM | POA: Diagnosis not present

## 2016-02-28 DIAGNOSIS — Z85110 Personal history of malignant carcinoid tumor of bronchus and lung: Secondary | ICD-10-CM | POA: Diagnosis not present

## 2016-02-28 DIAGNOSIS — C3431 Malignant neoplasm of lower lobe, right bronchus or lung: Secondary | ICD-10-CM | POA: Diagnosis not present

## 2016-03-03 DIAGNOSIS — C3490 Malignant neoplasm of unspecified part of unspecified bronchus or lung: Secondary | ICD-10-CM | POA: Diagnosis not present

## 2016-03-03 DIAGNOSIS — J449 Chronic obstructive pulmonary disease, unspecified: Secondary | ICD-10-CM | POA: Diagnosis not present

## 2016-03-03 DIAGNOSIS — R5383 Other fatigue: Secondary | ICD-10-CM | POA: Diagnosis not present

## 2016-03-10 DIAGNOSIS — R509 Fever, unspecified: Secondary | ICD-10-CM | POA: Diagnosis not present

## 2016-03-10 DIAGNOSIS — J189 Pneumonia, unspecified organism: Secondary | ICD-10-CM | POA: Diagnosis not present

## 2016-03-10 DIAGNOSIS — R531 Weakness: Secondary | ICD-10-CM | POA: Diagnosis not present

## 2016-03-13 DIAGNOSIS — J441 Chronic obstructive pulmonary disease with (acute) exacerbation: Secondary | ICD-10-CM | POA: Diagnosis not present

## 2016-03-13 DIAGNOSIS — J158 Pneumonia due to other specified bacteria: Secondary | ICD-10-CM | POA: Diagnosis not present

## 2016-03-25 DIAGNOSIS — Z6823 Body mass index (BMI) 23.0-23.9, adult: Secondary | ICD-10-CM | POA: Diagnosis not present

## 2016-03-25 DIAGNOSIS — E1165 Type 2 diabetes mellitus with hyperglycemia: Secondary | ICD-10-CM | POA: Diagnosis not present

## 2016-03-25 DIAGNOSIS — E119 Type 2 diabetes mellitus without complications: Secondary | ICD-10-CM | POA: Diagnosis not present

## 2016-03-25 DIAGNOSIS — Z0001 Encounter for general adult medical examination with abnormal findings: Secondary | ICD-10-CM | POA: Diagnosis not present

## 2016-03-25 DIAGNOSIS — E782 Mixed hyperlipidemia: Secondary | ICD-10-CM | POA: Diagnosis not present

## 2016-03-25 DIAGNOSIS — J410 Simple chronic bronchitis: Secondary | ICD-10-CM | POA: Diagnosis not present

## 2016-03-25 DIAGNOSIS — D649 Anemia, unspecified: Secondary | ICD-10-CM | POA: Diagnosis not present

## 2016-04-13 DIAGNOSIS — J449 Chronic obstructive pulmonary disease, unspecified: Secondary | ICD-10-CM | POA: Diagnosis not present

## 2016-04-23 DIAGNOSIS — I10 Essential (primary) hypertension: Secondary | ICD-10-CM | POA: Diagnosis not present

## 2016-04-23 DIAGNOSIS — E782 Mixed hyperlipidemia: Secondary | ICD-10-CM | POA: Diagnosis not present

## 2016-04-23 DIAGNOSIS — Z952 Presence of prosthetic heart valve: Secondary | ICD-10-CM | POA: Diagnosis not present

## 2016-05-27 DIAGNOSIS — I10 Essential (primary) hypertension: Secondary | ICD-10-CM | POA: Diagnosis not present

## 2016-05-27 DIAGNOSIS — R42 Dizziness and giddiness: Secondary | ICD-10-CM | POA: Diagnosis not present

## 2016-05-27 DIAGNOSIS — H6123 Impacted cerumen, bilateral: Secondary | ICD-10-CM | POA: Diagnosis not present

## 2016-05-27 DIAGNOSIS — R634 Abnormal weight loss: Secondary | ICD-10-CM | POA: Diagnosis not present

## 2016-05-27 DIAGNOSIS — J410 Simple chronic bronchitis: Secondary | ICD-10-CM | POA: Diagnosis not present

## 2016-06-11 DIAGNOSIS — J441 Chronic obstructive pulmonary disease with (acute) exacerbation: Secondary | ICD-10-CM | POA: Diagnosis not present

## 2016-06-11 DIAGNOSIS — S0990XA Unspecified injury of head, initial encounter: Secondary | ICD-10-CM | POA: Diagnosis not present

## 2016-06-11 DIAGNOSIS — R531 Weakness: Secondary | ICD-10-CM | POA: Diagnosis not present

## 2016-06-11 DIAGNOSIS — S299XXA Unspecified injury of thorax, initial encounter: Secondary | ICD-10-CM | POA: Diagnosis not present

## 2016-06-11 DIAGNOSIS — R0602 Shortness of breath: Secondary | ICD-10-CM | POA: Diagnosis not present

## 2016-06-25 DIAGNOSIS — C770 Secondary and unspecified malignant neoplasm of lymph nodes of head, face and neck: Secondary | ICD-10-CM | POA: Diagnosis not present

## 2016-06-25 DIAGNOSIS — J441 Chronic obstructive pulmonary disease with (acute) exacerbation: Secondary | ICD-10-CM | POA: Diagnosis not present

## 2016-06-25 DIAGNOSIS — C3431 Malignant neoplasm of lower lobe, right bronchus or lung: Secondary | ICD-10-CM | POA: Diagnosis not present

## 2016-06-25 DIAGNOSIS — I712 Thoracic aortic aneurysm, without rupture: Secondary | ICD-10-CM | POA: Diagnosis not present

## 2016-06-25 DIAGNOSIS — I7101 Dissection of thoracic aorta: Secondary | ICD-10-CM | POA: Diagnosis not present

## 2016-06-25 DIAGNOSIS — I2510 Atherosclerotic heart disease of native coronary artery without angina pectoris: Secondary | ICD-10-CM | POA: Diagnosis not present

## 2016-06-25 DIAGNOSIS — I700 Atherosclerosis of aorta: Secondary | ICD-10-CM | POA: Diagnosis not present

## 2016-06-25 DIAGNOSIS — K449 Diaphragmatic hernia without obstruction or gangrene: Secondary | ICD-10-CM | POA: Diagnosis not present

## 2016-06-25 DIAGNOSIS — J432 Centrilobular emphysema: Secondary | ICD-10-CM | POA: Diagnosis not present

## 2016-06-25 DIAGNOSIS — C7801 Secondary malignant neoplasm of right lung: Secondary | ICD-10-CM | POA: Diagnosis not present

## 2016-06-26 DIAGNOSIS — Z85118 Personal history of other malignant neoplasm of bronchus and lung: Secondary | ICD-10-CM | POA: Diagnosis not present

## 2016-06-26 DIAGNOSIS — C3411 Malignant neoplasm of upper lobe, right bronchus or lung: Secondary | ICD-10-CM | POA: Diagnosis not present

## 2016-06-26 DIAGNOSIS — C3490 Malignant neoplasm of unspecified part of unspecified bronchus or lung: Secondary | ICD-10-CM | POA: Diagnosis not present

## 2016-06-29 DIAGNOSIS — Z85819 Personal history of malignant neoplasm of unspecified site of lip, oral cavity, and pharynx: Secondary | ICD-10-CM | POA: Insufficient documentation

## 2016-06-29 DIAGNOSIS — G4734 Idiopathic sleep related nonobstructive alveolar hypoventilation: Secondary | ICD-10-CM

## 2016-06-29 DIAGNOSIS — J449 Chronic obstructive pulmonary disease, unspecified: Secondary | ICD-10-CM | POA: Diagnosis not present

## 2016-06-29 DIAGNOSIS — R490 Dysphonia: Secondary | ICD-10-CM | POA: Insufficient documentation

## 2016-06-29 DIAGNOSIS — K449 Diaphragmatic hernia without obstruction or gangrene: Secondary | ICD-10-CM

## 2016-06-29 DIAGNOSIS — Z85118 Personal history of other malignant neoplasm of bronchus and lung: Secondary | ICD-10-CM

## 2016-06-29 DIAGNOSIS — R918 Other nonspecific abnormal finding of lung field: Secondary | ICD-10-CM | POA: Insufficient documentation

## 2016-06-29 HISTORY — DX: Personal history of malignant neoplasm of unspecified site of lip, oral cavity, and pharynx: Z85.819

## 2016-06-29 HISTORY — DX: Other nonspecific abnormal finding of lung field: R91.8

## 2016-06-29 HISTORY — DX: Idiopathic sleep related nonobstructive alveolar hypoventilation: G47.34

## 2016-06-29 HISTORY — DX: Diaphragmatic hernia without obstruction or gangrene: K44.9

## 2016-06-29 HISTORY — DX: Personal history of other malignant neoplasm of bronchus and lung: Z85.118

## 2016-07-01 DIAGNOSIS — R7301 Impaired fasting glucose: Secondary | ICD-10-CM | POA: Diagnosis not present

## 2016-07-01 DIAGNOSIS — I10 Essential (primary) hypertension: Secondary | ICD-10-CM | POA: Diagnosis not present

## 2016-07-01 DIAGNOSIS — E782 Mixed hyperlipidemia: Secondary | ICD-10-CM | POA: Diagnosis not present

## 2016-07-01 DIAGNOSIS — J101 Influenza due to other identified influenza virus with other respiratory manifestations: Secondary | ICD-10-CM | POA: Diagnosis not present

## 2016-07-01 DIAGNOSIS — D508 Other iron deficiency anemias: Secondary | ICD-10-CM | POA: Diagnosis not present

## 2016-07-01 DIAGNOSIS — J441 Chronic obstructive pulmonary disease with (acute) exacerbation: Secondary | ICD-10-CM | POA: Diagnosis not present

## 2016-07-09 DIAGNOSIS — I2510 Atherosclerotic heart disease of native coronary artery without angina pectoris: Secondary | ICD-10-CM | POA: Diagnosis not present

## 2016-07-09 DIAGNOSIS — C3490 Malignant neoplasm of unspecified part of unspecified bronchus or lung: Secondary | ICD-10-CM | POA: Diagnosis not present

## 2016-07-09 DIAGNOSIS — J439 Emphysema, unspecified: Secondary | ICD-10-CM | POA: Diagnosis not present

## 2016-07-09 DIAGNOSIS — R911 Solitary pulmonary nodule: Secondary | ICD-10-CM | POA: Diagnosis not present

## 2016-07-09 DIAGNOSIS — I714 Abdominal aortic aneurysm, without rupture: Secondary | ICD-10-CM | POA: Diagnosis not present

## 2016-07-09 DIAGNOSIS — I700 Atherosclerosis of aorta: Secondary | ICD-10-CM | POA: Diagnosis not present

## 2016-07-14 DIAGNOSIS — R490 Dysphonia: Secondary | ICD-10-CM | POA: Diagnosis not present

## 2016-07-14 DIAGNOSIS — Z85819 Personal history of malignant neoplasm of unspecified site of lip, oral cavity, and pharynx: Secondary | ICD-10-CM | POA: Diagnosis not present

## 2016-07-15 DIAGNOSIS — Z85118 Personal history of other malignant neoplasm of bronchus and lung: Secondary | ICD-10-CM | POA: Diagnosis not present

## 2016-07-15 DIAGNOSIS — R490 Dysphonia: Secondary | ICD-10-CM | POA: Diagnosis not present

## 2016-07-15 DIAGNOSIS — K449 Diaphragmatic hernia without obstruction or gangrene: Secondary | ICD-10-CM | POA: Diagnosis not present

## 2016-07-15 DIAGNOSIS — J449 Chronic obstructive pulmonary disease, unspecified: Secondary | ICD-10-CM | POA: Diagnosis not present

## 2016-07-15 DIAGNOSIS — G4734 Idiopathic sleep related nonobstructive alveolar hypoventilation: Secondary | ICD-10-CM | POA: Diagnosis not present

## 2016-07-15 DIAGNOSIS — R918 Other nonspecific abnormal finding of lung field: Secondary | ICD-10-CM | POA: Diagnosis not present

## 2016-10-01 DIAGNOSIS — D508 Other iron deficiency anemias: Secondary | ICD-10-CM | POA: Diagnosis not present

## 2016-10-01 DIAGNOSIS — I10 Essential (primary) hypertension: Secondary | ICD-10-CM | POA: Diagnosis not present

## 2016-10-01 DIAGNOSIS — J441 Chronic obstructive pulmonary disease with (acute) exacerbation: Secondary | ICD-10-CM | POA: Diagnosis not present

## 2016-10-01 DIAGNOSIS — E782 Mixed hyperlipidemia: Secondary | ICD-10-CM | POA: Diagnosis not present

## 2016-10-01 DIAGNOSIS — R7301 Impaired fasting glucose: Secondary | ICD-10-CM | POA: Diagnosis not present

## 2016-10-07 DIAGNOSIS — I739 Peripheral vascular disease, unspecified: Secondary | ICD-10-CM | POA: Diagnosis not present

## 2016-10-07 DIAGNOSIS — I714 Abdominal aortic aneurysm, without rupture: Secondary | ICD-10-CM | POA: Diagnosis not present

## 2016-10-07 DIAGNOSIS — Z95820 Peripheral vascular angioplasty status with implants and grafts: Secondary | ICD-10-CM | POA: Diagnosis not present

## 2016-10-19 DIAGNOSIS — C44319 Basal cell carcinoma of skin of other parts of face: Secondary | ICD-10-CM | POA: Diagnosis not present

## 2016-10-19 DIAGNOSIS — C44311 Basal cell carcinoma of skin of nose: Secondary | ICD-10-CM | POA: Diagnosis not present

## 2016-10-26 DIAGNOSIS — I700 Atherosclerosis of aorta: Secondary | ICD-10-CM | POA: Diagnosis not present

## 2016-10-26 DIAGNOSIS — J439 Emphysema, unspecified: Secondary | ICD-10-CM | POA: Diagnosis not present

## 2016-10-26 DIAGNOSIS — C3431 Malignant neoplasm of lower lobe, right bronchus or lung: Secondary | ICD-10-CM | POA: Diagnosis not present

## 2016-10-26 DIAGNOSIS — I712 Thoracic aortic aneurysm, without rupture: Secondary | ICD-10-CM | POA: Diagnosis not present

## 2016-10-27 DIAGNOSIS — L570 Actinic keratosis: Secondary | ICD-10-CM | POA: Diagnosis not present

## 2016-10-27 DIAGNOSIS — C44122 Squamous cell carcinoma of skin of right eyelid, including canthus: Secondary | ICD-10-CM | POA: Diagnosis not present

## 2016-10-29 DIAGNOSIS — R918 Other nonspecific abnormal finding of lung field: Secondary | ICD-10-CM | POA: Diagnosis not present

## 2016-10-29 DIAGNOSIS — Z85118 Personal history of other malignant neoplasm of bronchus and lung: Secondary | ICD-10-CM | POA: Diagnosis not present

## 2016-11-03 DIAGNOSIS — J449 Chronic obstructive pulmonary disease, unspecified: Secondary | ICD-10-CM | POA: Diagnosis not present

## 2016-11-03 DIAGNOSIS — Z952 Presence of prosthetic heart valve: Secondary | ICD-10-CM | POA: Diagnosis not present

## 2016-11-03 DIAGNOSIS — I7101 Dissection of thoracic aorta: Secondary | ICD-10-CM | POA: Diagnosis not present

## 2016-11-03 DIAGNOSIS — I10 Essential (primary) hypertension: Secondary | ICD-10-CM | POA: Diagnosis not present

## 2016-11-03 DIAGNOSIS — E784 Other hyperlipidemia: Secondary | ICD-10-CM | POA: Diagnosis not present

## 2016-11-03 DIAGNOSIS — C3490 Malignant neoplasm of unspecified part of unspecified bronchus or lung: Secondary | ICD-10-CM | POA: Diagnosis not present

## 2016-11-03 DIAGNOSIS — F17211 Nicotine dependence, cigarettes, in remission: Secondary | ICD-10-CM | POA: Diagnosis not present

## 2016-11-03 DIAGNOSIS — Z8249 Family history of ischemic heart disease and other diseases of the circulatory system: Secondary | ICD-10-CM | POA: Diagnosis not present

## 2016-12-08 DIAGNOSIS — C44319 Basal cell carcinoma of skin of other parts of face: Secondary | ICD-10-CM | POA: Diagnosis not present

## 2016-12-21 DIAGNOSIS — Z23 Encounter for immunization: Secondary | ICD-10-CM | POA: Diagnosis not present

## 2016-12-21 DIAGNOSIS — K449 Diaphragmatic hernia without obstruction or gangrene: Secondary | ICD-10-CM | POA: Diagnosis not present

## 2016-12-21 DIAGNOSIS — J8410 Pulmonary fibrosis, unspecified: Secondary | ICD-10-CM | POA: Diagnosis not present

## 2016-12-21 DIAGNOSIS — G4734 Idiopathic sleep related nonobstructive alveolar hypoventilation: Secondary | ICD-10-CM | POA: Diagnosis not present

## 2016-12-21 DIAGNOSIS — C3491 Malignant neoplasm of unspecified part of right bronchus or lung: Secondary | ICD-10-CM | POA: Diagnosis not present

## 2016-12-21 DIAGNOSIS — J449 Chronic obstructive pulmonary disease, unspecified: Secondary | ICD-10-CM | POA: Diagnosis not present

## 2016-12-28 DIAGNOSIS — B079 Viral wart, unspecified: Secondary | ICD-10-CM | POA: Diagnosis not present

## 2016-12-28 DIAGNOSIS — L570 Actinic keratosis: Secondary | ICD-10-CM | POA: Diagnosis not present

## 2016-12-28 DIAGNOSIS — L578 Other skin changes due to chronic exposure to nonionizing radiation: Secondary | ICD-10-CM | POA: Diagnosis not present

## 2017-01-05 DIAGNOSIS — J441 Chronic obstructive pulmonary disease with (acute) exacerbation: Secondary | ICD-10-CM | POA: Diagnosis not present

## 2017-01-07 DIAGNOSIS — I10 Essential (primary) hypertension: Secondary | ICD-10-CM | POA: Diagnosis not present

## 2017-01-07 DIAGNOSIS — J441 Chronic obstructive pulmonary disease with (acute) exacerbation: Secondary | ICD-10-CM | POA: Diagnosis not present

## 2017-01-07 DIAGNOSIS — D508 Other iron deficiency anemias: Secondary | ICD-10-CM | POA: Diagnosis not present

## 2017-01-07 DIAGNOSIS — Z6825 Body mass index (BMI) 25.0-25.9, adult: Secondary | ICD-10-CM | POA: Diagnosis not present

## 2017-01-07 DIAGNOSIS — Z23 Encounter for immunization: Secondary | ICD-10-CM | POA: Diagnosis not present

## 2017-01-07 DIAGNOSIS — C770 Secondary and unspecified malignant neoplasm of lymph nodes of head, face and neck: Secondary | ICD-10-CM | POA: Diagnosis not present

## 2017-01-07 DIAGNOSIS — R7301 Impaired fasting glucose: Secondary | ICD-10-CM | POA: Diagnosis not present

## 2017-01-07 DIAGNOSIS — E782 Mixed hyperlipidemia: Secondary | ICD-10-CM | POA: Diagnosis not present

## 2017-01-26 DIAGNOSIS — Z86010 Personal history of colonic polyps: Secondary | ICD-10-CM | POA: Diagnosis not present

## 2017-01-26 DIAGNOSIS — K589 Irritable bowel syndrome without diarrhea: Secondary | ICD-10-CM | POA: Diagnosis not present

## 2017-01-27 DIAGNOSIS — G4734 Idiopathic sleep related nonobstructive alveolar hypoventilation: Secondary | ICD-10-CM | POA: Diagnosis not present

## 2017-02-01 DIAGNOSIS — D125 Benign neoplasm of sigmoid colon: Secondary | ICD-10-CM | POA: Diagnosis not present

## 2017-02-01 DIAGNOSIS — D127 Benign neoplasm of rectosigmoid junction: Secondary | ICD-10-CM | POA: Diagnosis not present

## 2017-02-01 DIAGNOSIS — D126 Benign neoplasm of colon, unspecified: Secondary | ICD-10-CM | POA: Diagnosis not present

## 2017-02-01 DIAGNOSIS — Z86010 Personal history of colonic polyps: Secondary | ICD-10-CM | POA: Diagnosis not present

## 2017-02-01 DIAGNOSIS — D128 Benign neoplasm of rectum: Secondary | ICD-10-CM | POA: Diagnosis not present

## 2017-03-01 DIAGNOSIS — Z87891 Personal history of nicotine dependence: Secondary | ICD-10-CM | POA: Diagnosis not present

## 2017-03-01 DIAGNOSIS — R0602 Shortness of breath: Secondary | ICD-10-CM | POA: Diagnosis not present

## 2017-03-01 DIAGNOSIS — J9601 Acute respiratory failure with hypoxia: Secondary | ICD-10-CM | POA: Diagnosis not present

## 2017-03-01 DIAGNOSIS — E7800 Pure hypercholesterolemia, unspecified: Secondary | ICD-10-CM | POA: Diagnosis present

## 2017-03-01 DIAGNOSIS — J9621 Acute and chronic respiratory failure with hypoxia: Secondary | ICD-10-CM | POA: Diagnosis not present

## 2017-03-01 DIAGNOSIS — Z7982 Long term (current) use of aspirin: Secondary | ICD-10-CM | POA: Diagnosis not present

## 2017-03-01 DIAGNOSIS — I2510 Atherosclerotic heart disease of native coronary artery without angina pectoris: Secondary | ICD-10-CM | POA: Diagnosis not present

## 2017-03-01 DIAGNOSIS — J440 Chronic obstructive pulmonary disease with acute lower respiratory infection: Secondary | ICD-10-CM | POA: Diagnosis present

## 2017-03-01 DIAGNOSIS — I10 Essential (primary) hypertension: Secondary | ICD-10-CM | POA: Diagnosis not present

## 2017-03-01 DIAGNOSIS — J449 Chronic obstructive pulmonary disease, unspecified: Secondary | ICD-10-CM | POA: Diagnosis not present

## 2017-03-01 DIAGNOSIS — J181 Lobar pneumonia, unspecified organism: Secondary | ICD-10-CM | POA: Diagnosis not present

## 2017-03-01 DIAGNOSIS — I119 Hypertensive heart disease without heart failure: Secondary | ICD-10-CM | POA: Diagnosis present

## 2017-03-01 DIAGNOSIS — I719 Aortic aneurysm of unspecified site, without rupture: Secondary | ICD-10-CM | POA: Diagnosis not present

## 2017-03-01 DIAGNOSIS — M1990 Unspecified osteoarthritis, unspecified site: Secondary | ICD-10-CM | POA: Diagnosis present

## 2017-03-01 DIAGNOSIS — J159 Unspecified bacterial pneumonia: Secondary | ICD-10-CM | POA: Diagnosis not present

## 2017-03-01 DIAGNOSIS — Z888 Allergy status to other drugs, medicaments and biological substances status: Secondary | ICD-10-CM | POA: Diagnosis not present

## 2017-03-01 DIAGNOSIS — Z85118 Personal history of other malignant neoplasm of bronchus and lung: Secondary | ICD-10-CM | POA: Diagnosis not present

## 2017-03-01 DIAGNOSIS — Z79899 Other long term (current) drug therapy: Secondary | ICD-10-CM | POA: Diagnosis not present

## 2017-03-01 DIAGNOSIS — F418 Other specified anxiety disorders: Secondary | ICD-10-CM | POA: Diagnosis present

## 2017-03-01 DIAGNOSIS — I714 Abdominal aortic aneurysm, without rupture: Secondary | ICD-10-CM | POA: Diagnosis present

## 2017-03-09 DIAGNOSIS — J441 Chronic obstructive pulmonary disease with (acute) exacerbation: Secondary | ICD-10-CM | POA: Diagnosis not present

## 2017-03-09 DIAGNOSIS — J9621 Acute and chronic respiratory failure with hypoxia: Secondary | ICD-10-CM | POA: Diagnosis not present

## 2017-03-09 DIAGNOSIS — J158 Pneumonia due to other specified bacteria: Secondary | ICD-10-CM | POA: Diagnosis not present

## 2017-03-29 DIAGNOSIS — Z6825 Body mass index (BMI) 25.0-25.9, adult: Secondary | ICD-10-CM | POA: Diagnosis not present

## 2017-03-29 DIAGNOSIS — F17298 Nicotine dependence, other tobacco product, with other nicotine-induced disorders: Secondary | ICD-10-CM | POA: Diagnosis not present

## 2017-03-29 DIAGNOSIS — Z0000 Encounter for general adult medical examination without abnormal findings: Secondary | ICD-10-CM | POA: Diagnosis not present

## 2017-04-12 DIAGNOSIS — J449 Chronic obstructive pulmonary disease, unspecified: Secondary | ICD-10-CM | POA: Diagnosis not present

## 2017-04-12 DIAGNOSIS — C3491 Malignant neoplasm of unspecified part of right bronchus or lung: Secondary | ICD-10-CM | POA: Diagnosis not present

## 2017-04-12 DIAGNOSIS — R0600 Dyspnea, unspecified: Secondary | ICD-10-CM | POA: Diagnosis not present

## 2017-04-12 DIAGNOSIS — J8410 Pulmonary fibrosis, unspecified: Secondary | ICD-10-CM | POA: Diagnosis not present

## 2017-04-28 DIAGNOSIS — C44311 Basal cell carcinoma of skin of nose: Secondary | ICD-10-CM | POA: Diagnosis not present

## 2017-04-28 DIAGNOSIS — L570 Actinic keratosis: Secondary | ICD-10-CM | POA: Diagnosis not present

## 2017-04-28 DIAGNOSIS — C441191 Basal cell carcinoma of skin of left upper eyelid, including canthus: Secondary | ICD-10-CM | POA: Diagnosis not present

## 2017-04-28 DIAGNOSIS — C44321 Squamous cell carcinoma of skin of nose: Secondary | ICD-10-CM | POA: Diagnosis not present

## 2017-04-28 DIAGNOSIS — C44319 Basal cell carcinoma of skin of other parts of face: Secondary | ICD-10-CM | POA: Diagnosis not present

## 2017-04-29 DIAGNOSIS — C3491 Malignant neoplasm of unspecified part of right bronchus or lung: Secondary | ICD-10-CM | POA: Diagnosis not present

## 2017-04-29 DIAGNOSIS — C3431 Malignant neoplasm of lower lobe, right bronchus or lung: Secondary | ICD-10-CM | POA: Diagnosis not present

## 2017-04-30 DIAGNOSIS — Z923 Personal history of irradiation: Secondary | ICD-10-CM | POA: Diagnosis not present

## 2017-04-30 DIAGNOSIS — Z85118 Personal history of other malignant neoplasm of bronchus and lung: Secondary | ICD-10-CM | POA: Diagnosis not present

## 2017-04-30 DIAGNOSIS — R918 Other nonspecific abnormal finding of lung field: Secondary | ICD-10-CM | POA: Diagnosis not present

## 2017-04-30 DIAGNOSIS — J439 Emphysema, unspecified: Secondary | ICD-10-CM | POA: Diagnosis not present

## 2017-04-30 DIAGNOSIS — C3431 Malignant neoplasm of lower lobe, right bronchus or lung: Secondary | ICD-10-CM | POA: Diagnosis not present

## 2017-05-07 DIAGNOSIS — Z6825 Body mass index (BMI) 25.0-25.9, adult: Secondary | ICD-10-CM | POA: Diagnosis not present

## 2017-05-07 DIAGNOSIS — I10 Essential (primary) hypertension: Secondary | ICD-10-CM | POA: Diagnosis not present

## 2017-05-07 DIAGNOSIS — R7301 Impaired fasting glucose: Secondary | ICD-10-CM | POA: Diagnosis not present

## 2017-05-07 DIAGNOSIS — D508 Other iron deficiency anemias: Secondary | ICD-10-CM | POA: Diagnosis not present

## 2017-05-07 DIAGNOSIS — E782 Mixed hyperlipidemia: Secondary | ICD-10-CM | POA: Diagnosis not present

## 2017-05-07 DIAGNOSIS — J441 Chronic obstructive pulmonary disease with (acute) exacerbation: Secondary | ICD-10-CM | POA: Diagnosis not present

## 2017-05-12 DIAGNOSIS — C44321 Squamous cell carcinoma of skin of nose: Secondary | ICD-10-CM | POA: Diagnosis not present

## 2017-05-12 DIAGNOSIS — C44311 Basal cell carcinoma of skin of nose: Secondary | ICD-10-CM | POA: Diagnosis not present

## 2017-05-12 DIAGNOSIS — C441191 Basal cell carcinoma of skin of left upper eyelid, including canthus: Secondary | ICD-10-CM | POA: Diagnosis not present

## 2017-06-08 DIAGNOSIS — C44311 Basal cell carcinoma of skin of nose: Secondary | ICD-10-CM | POA: Diagnosis not present

## 2017-06-08 DIAGNOSIS — L0100 Impetigo, unspecified: Secondary | ICD-10-CM | POA: Diagnosis not present

## 2017-06-23 DIAGNOSIS — M25512 Pain in left shoulder: Secondary | ICD-10-CM | POA: Diagnosis not present

## 2017-06-28 DIAGNOSIS — M25512 Pain in left shoulder: Secondary | ICD-10-CM | POA: Diagnosis not present

## 2017-06-28 DIAGNOSIS — M19012 Primary osteoarthritis, left shoulder: Secondary | ICD-10-CM | POA: Diagnosis not present

## 2017-07-08 DIAGNOSIS — M75102 Unspecified rotator cuff tear or rupture of left shoulder, not specified as traumatic: Secondary | ICD-10-CM | POA: Diagnosis not present

## 2017-07-16 DIAGNOSIS — G4734 Idiopathic sleep related nonobstructive alveolar hypoventilation: Secondary | ICD-10-CM | POA: Diagnosis not present

## 2017-07-16 DIAGNOSIS — C3491 Malignant neoplasm of unspecified part of right bronchus or lung: Secondary | ICD-10-CM | POA: Diagnosis not present

## 2017-07-16 DIAGNOSIS — R0600 Dyspnea, unspecified: Secondary | ICD-10-CM | POA: Diagnosis not present

## 2017-07-16 DIAGNOSIS — J449 Chronic obstructive pulmonary disease, unspecified: Secondary | ICD-10-CM | POA: Diagnosis not present

## 2017-07-16 DIAGNOSIS — J8410 Pulmonary fibrosis, unspecified: Secondary | ICD-10-CM | POA: Diagnosis not present

## 2017-08-11 DIAGNOSIS — E782 Mixed hyperlipidemia: Secondary | ICD-10-CM | POA: Diagnosis not present

## 2017-08-11 DIAGNOSIS — R7301 Impaired fasting glucose: Secondary | ICD-10-CM | POA: Diagnosis not present

## 2017-08-11 DIAGNOSIS — I10 Essential (primary) hypertension: Secondary | ICD-10-CM | POA: Diagnosis not present

## 2017-08-11 DIAGNOSIS — Z6825 Body mass index (BMI) 25.0-25.9, adult: Secondary | ICD-10-CM | POA: Diagnosis not present

## 2017-08-11 DIAGNOSIS — D508 Other iron deficiency anemias: Secondary | ICD-10-CM | POA: Diagnosis not present

## 2017-08-11 DIAGNOSIS — J441 Chronic obstructive pulmonary disease with (acute) exacerbation: Secondary | ICD-10-CM | POA: Diagnosis not present

## 2017-09-29 DIAGNOSIS — C3491 Malignant neoplasm of unspecified part of right bronchus or lung: Secondary | ICD-10-CM | POA: Diagnosis not present

## 2017-09-29 DIAGNOSIS — I712 Thoracic aortic aneurysm, without rupture: Secondary | ICD-10-CM | POA: Diagnosis not present

## 2017-09-29 DIAGNOSIS — C3431 Malignant neoplasm of lower lobe, right bronchus or lung: Secondary | ICD-10-CM | POA: Diagnosis not present

## 2017-09-29 DIAGNOSIS — R918 Other nonspecific abnormal finding of lung field: Secondary | ICD-10-CM | POA: Diagnosis not present

## 2017-09-30 DIAGNOSIS — Z85118 Personal history of other malignant neoplasm of bronchus and lung: Secondary | ICD-10-CM | POA: Diagnosis not present

## 2017-09-30 DIAGNOSIS — R918 Other nonspecific abnormal finding of lung field: Secondary | ICD-10-CM | POA: Diagnosis not present

## 2017-09-30 DIAGNOSIS — Z923 Personal history of irradiation: Secondary | ICD-10-CM | POA: Diagnosis not present

## 2017-10-08 ENCOUNTER — Encounter: Payer: Self-pay | Admitting: Cardiology

## 2017-10-08 DIAGNOSIS — R918 Other nonspecific abnormal finding of lung field: Secondary | ICD-10-CM | POA: Diagnosis not present

## 2017-10-08 DIAGNOSIS — C3431 Malignant neoplasm of lower lobe, right bronchus or lung: Secondary | ICD-10-CM | POA: Diagnosis not present

## 2017-10-13 ENCOUNTER — Encounter: Payer: Self-pay | Admitting: Cardiology

## 2017-10-14 DIAGNOSIS — Z923 Personal history of irradiation: Secondary | ICD-10-CM | POA: Diagnosis not present

## 2017-10-14 DIAGNOSIS — R918 Other nonspecific abnormal finding of lung field: Secondary | ICD-10-CM | POA: Diagnosis not present

## 2017-10-14 DIAGNOSIS — Z85118 Personal history of other malignant neoplasm of bronchus and lung: Secondary | ICD-10-CM | POA: Diagnosis not present

## 2017-10-20 DIAGNOSIS — I712 Thoracic aortic aneurysm, without rupture: Secondary | ICD-10-CM | POA: Diagnosis not present

## 2017-10-20 DIAGNOSIS — C3490 Malignant neoplasm of unspecified part of unspecified bronchus or lung: Secondary | ICD-10-CM | POA: Diagnosis not present

## 2017-10-20 DIAGNOSIS — C3431 Malignant neoplasm of lower lobe, right bronchus or lung: Secondary | ICD-10-CM | POA: Diagnosis not present

## 2017-10-20 DIAGNOSIS — I700 Atherosclerosis of aorta: Secondary | ICD-10-CM | POA: Diagnosis not present

## 2017-10-20 DIAGNOSIS — K449 Diaphragmatic hernia without obstruction or gangrene: Secondary | ICD-10-CM | POA: Diagnosis not present

## 2017-10-20 DIAGNOSIS — R590 Localized enlarged lymph nodes: Secondary | ICD-10-CM | POA: Diagnosis not present

## 2017-10-22 DIAGNOSIS — R918 Other nonspecific abnormal finding of lung field: Secondary | ICD-10-CM | POA: Diagnosis not present

## 2017-10-22 DIAGNOSIS — Z85118 Personal history of other malignant neoplasm of bronchus and lung: Secondary | ICD-10-CM | POA: Diagnosis not present

## 2017-10-22 DIAGNOSIS — Z9221 Personal history of antineoplastic chemotherapy: Secondary | ICD-10-CM | POA: Diagnosis not present

## 2017-10-22 DIAGNOSIS — Z923 Personal history of irradiation: Secondary | ICD-10-CM | POA: Diagnosis not present

## 2017-10-27 ENCOUNTER — Ambulatory Visit (INDEPENDENT_AMBULATORY_CARE_PROVIDER_SITE_OTHER): Payer: Medicare Other | Admitting: Cardiology

## 2017-10-27 ENCOUNTER — Encounter: Payer: Self-pay | Admitting: Cardiology

## 2017-10-27 VITALS — BP 112/68 | HR 76 | Ht 68.0 in | Wt 164.0 lb

## 2017-10-27 DIAGNOSIS — Z952 Presence of prosthetic heart valve: Secondary | ICD-10-CM | POA: Diagnosis not present

## 2017-10-27 DIAGNOSIS — I739 Peripheral vascular disease, unspecified: Secondary | ICD-10-CM | POA: Diagnosis not present

## 2017-10-27 DIAGNOSIS — I10 Essential (primary) hypertension: Secondary | ICD-10-CM | POA: Diagnosis not present

## 2017-10-27 DIAGNOSIS — Z85118 Personal history of other malignant neoplasm of bronchus and lung: Secondary | ICD-10-CM

## 2017-10-27 DIAGNOSIS — Z85819 Personal history of malignant neoplasm of unspecified site of lip, oral cavity, and pharynx: Secondary | ICD-10-CM | POA: Diagnosis not present

## 2017-10-27 DIAGNOSIS — E785 Hyperlipidemia, unspecified: Secondary | ICD-10-CM | POA: Diagnosis not present

## 2017-10-27 DIAGNOSIS — E782 Mixed hyperlipidemia: Secondary | ICD-10-CM | POA: Diagnosis not present

## 2017-10-27 MED ORDER — ASPIRIN EC 81 MG PO TBEC: 81.0000 mg | DELAYED_RELEASE_TABLET | Freq: Every day | ORAL | 3 refills | Status: AC

## 2017-10-27 NOTE — Patient Instructions (Signed)
Medication Instructions:  Your physician has recommended you make the following change in your medication:  CHANGE aspirin 81 mg daily  Labwork: none  Testing/Procedures: none  Follow-Up: Your physician recommends that you schedule a follow-up appointment in: 6 months  Any Other Special Instructions Will Be Listed Below (If Applicable).     If you need a refill on your cardiac medications before your next appointment, please call your pharmacy.   [place], RN, BSN

## 2017-10-27 NOTE — Progress Notes (Signed)
Cardiology Office Note:    Date:  10/27/2017   ID:  Tommy [name], DOB [birth year], MRN [id number]  PCP:  [name], MD  Cardiologist:  Jenean Lindau, MD   Referring MD: [name], MD    ASSESSMENT:    1. Mixed hyperlipidemia   2. PVD (peripheral vascular disease) ([place])   3. Essential hypertension   4. H/O aortic valve replacement   5. History of throat cancer   6. H/O: lung cancer   7. Dyslipidemia    PLAN:    In order of problems listed above:  1. Secondary prevention stressed with the patient.  Importance of compliance with diet and medications stressed and he vocalized understanding.  His blood pressure is stable he is an active gentleman and I told him to maintain his activity level is appropriate.  Again he has multiple comorbidities and has survived multiple cancers.  We will try to obtain the records of his cardiac evaluation from his previous cardiac provider. 2. Patient will be seen in follow-up appointment in 6 months or earlier if the patient has any concerns    Medication Adjustments/Labs and Tests Ordered: Current medicines are reviewed at length with the patient today.  Concerns regarding medicines are outlined above.  No orders of the defined types were placed in this encounter.  No orders of the defined types were placed in this encounter.    No chief complaint on file.    History of Present Illness:    Tommy [name] is a 77 y.o. male.  Patient has multiple medical problems.  Atherosclerotic vascular disease and multiple cancers.  This patient has been under my care in my previous practice.  He is here now to transfer his care and be established with my current practice.  The patient mentions to me that he is lesions possibly of cancer have recurred on his chest and these were evident on his CT scan and PET scan done at [facility].  He is planning to undergo biopsy tomorrow.  He denies any chest pain orthopnea or PND.  His wife and daughter  accompany him for this appointment.  Patient takes care of activities of daily living without any problem.  He has a prosthetic aortic valve.  At the time of my evaluation, the patient is alert awake oriented and in no distress.  Past Medical History:  Diagnosis Date  . Abdominal aortic aneurysm (AAA) without rupture ([place]) 10/04/2015  . Aneurysm ([place])   . Cancer of neck ([place])   . Diabetes mellitus without complication ([place])   . Dyslipidemia 12/08/2007   Qualifier: Diagnosis of  By: Tilden Dome  . Emphysema   . H/O aortic valve replacement 10/08/2014  . H/O: lung cancer 06/29/2016  . Hiatal hernia 06/29/2016  . History of throat cancer 06/29/2016   Last Assessment & Plan:  Concern over increasing hoarseness with history of throat cancer. Had a tumor removed from the neck 13 years ago by Dr. Conley Canal.  Unclear tumor type or primary origin.  Lately he has been getting more hoarse.  He no longer smokes though he dips tobacco.  No obvious heartburn.  He does have a history of lung cancer.  Recent PET scan showed no uptake in the larynx region. E  . Hypercholesterolemia   . Hyperlipemia 10/05/2014  . Hypertension   . Lung cancer ([place]) 10/05/2014  . 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 Mass of upper lobe of right lung 06/29/2016  . Nocturnal hypoxemia 06/29/2016  .  PVD (peripheral vascular disease) ([place]) 10/04/2015  . Radiation 04/17/14, 04/19/14, 04/23/14, 04/25/14, 04/27/14   SBRT to right upper lobe 55 gray  . Squamous cell lung cancer ([place])    right upper lobe    Past Surgical History:  Procedure Laterality Date  . AORTIC VALVE REPLACEMENT    . CARDIAC VALVE SURGERY     01/28/2009 AVR with pericardial tissue valve  . LUNG SURGERY    . neck cancer surgery    . Partial RLL Resection     [facility] March 09'  . TONSILECTOMY/ADENOIDECTOMY WITH MYRINGOTOMY    . TONSILLECTOMY    . TUMOR REMOVAL     left neck 2007    Current Medications: Current Meds  Medication Sig  . albuterol (PROAIR HFA) 108 (90 BASE) MCG/ACT  inhaler Inhale 2 puffs into the lungs every 6 (six) hours as needed for wheezing.  Marland Kitchen albuterol (PROVENTIL) (2.5 MG/3ML) 0.083% nebulizer solution Take 2.5 mg by nebulization every 6 (six) hours as needed for wheezing or shortness of breath.  Marland Kitchen aspirin 325 MG tablet Take 325 mg by mouth daily.  . budesonide (PULMICORT) 0.5 MG/2ML nebulizer solution Take 0.5 mg by nebulization 2 (two) times daily.  . clonazePAM (KLONOPIN) 1 MG tablet Take 1 mg by mouth 2 (two) times daily as needed for anxiety.  . digoxin (LANOXIN) 0.125 MG tablet Take 0.125 mg by mouth daily.  Marland Kitchen ezetimibe (ZETIA) 10 MG tablet Take 10 mg by mouth daily.  . formoterol (PERFOROMIST) 20 MCG/2ML nebulizer solution Take 20 mcg by nebulization 2 (two) times daily.  Marland Kitchen losartan-hydrochlorothiazide (HYZAAR) 50-12.5 MG per tablet Take 1 tablet by mouth daily as needed. Take 1/2 to 1 tablet by mouth once daily as needed for elevated BP  . metoprolol tartrate (LOPRESSOR) 25 MG tablet 1/2 tablet twice per day  . nitroGLYCERIN (NITROSTAT) 0.4 MG SL tablet Place 0.4 mg under the tongue as needed for chest pain.   . vitamin E 400 UNIT capsule Take 400 Units by mouth daily.     Allergies:   Patient has no known allergies.   Social History   Socioeconomic History  . Marital status: Married    Spouse name: Not on file  . Number of children: 1  . Years of education: Not on file  . Highest education level: Not on file  Occupational History  . Occupation: Retired    Fish farm manager: RETIRED  Social Needs  . Financial resource strain: Not on file  . Food insecurity:    Worry: Not on file    Inability: Not on file  . Transportation needs:    Medical: Not on file    Non-medical: Not on file  Tobacco Use  . Smoking status: Former Smoker    Packs/day: 2.00    Years: 45.00    Pack years: 90.00    Types: Cigarettes    Last attempt to quit: 03/02/2000    Years since quitting: 17.6  . Smokeless tobacco: Current User    Types: Chew  Substance and  Sexual Activity  . Alcohol use: No  . Drug use: No  . Sexual activity: Not on file  Lifestyle  . Physical activity:    Days per week: Not on file    Minutes per session: Not on file  . Stress: Not on file  Relationships  . Social connections:    Talks on phone: Not on file    Gets together: Not on file    Attends religious service: Not on file  Active member of club or organization: Not on file    Attends meetings of clubs or organizations: Not on file    Relationship status: Not on file  Other Topics Concern  . Not on file  Social History Narrative  . Not on file     Family History: The patient's family history includes Alzheimer's disease in his mother; Brain cancer in his father; Emphysema in his brother; Heart disease in his mother; Stroke in his father.  ROS:   Please see the history of present illness.    All other systems reviewed and are negative.  EKGs/Labs/Other Studies Reviewed:    The following studies were reviewed today: I discussed my findings with the patient at extensive length and EKG runs with sinus rhythm and nonspecific ST changes   Recent Labs: No results found for requested labs within last 8760 hours.  Recent Lipid Panel No results found for: CHOL, TRIG, HDL, CHOLHDL, VLDL, LDLCALC, LDLDIRECT  Physical Exam:    VS:  BP 112/68 (BP Location: Right Arm, Patient Position: Sitting, Cuff Size: Normal)   Pulse 76   Ht 5\' 8"  (1.727 m)   Wt 164 lb (74.4 kg)   SpO2 95%   BMI 24.94 kg/m     Wt Readings from Last 3 Encounters:  10/27/17 164 lb (74.4 kg)  10/17/15 170 lb (77.1 kg)  09/26/15 175 lb (79.4 kg)     GEN: Patient is in no acute distress HEENT: Normal NECK: No JVD; No carotid bruits LYMPHATICS: No lymphadenopathy CARDIAC: Hear sounds regular, 2/6 systolic murmur at the apex. RESPIRATORY:  Clear to auscultation without rales, wheezing or rhonchi  ABDOMEN: Soft, non-tender, non-distended MUSCULOSKELETAL:  No edema; No deformity    SKIN: Warm and dry NEUROLOGIC:  Alert and oriented x 3 PSYCHIATRIC:  Normal affect   Signed, Jenean Lindau, MD  10/27/2017 10:12 AM    [place]

## 2017-10-28 DIAGNOSIS — C3412 Malignant neoplasm of upper lobe, left bronchus or lung: Secondary | ICD-10-CM | POA: Diagnosis not present

## 2017-10-28 DIAGNOSIS — R911 Solitary pulmonary nodule: Secondary | ICD-10-CM | POA: Diagnosis not present

## 2017-10-29 DIAGNOSIS — E860 Dehydration: Secondary | ICD-10-CM | POA: Diagnosis not present

## 2017-10-29 DIAGNOSIS — M1611 Unilateral primary osteoarthritis, right hip: Secondary | ICD-10-CM | POA: Diagnosis not present

## 2017-10-29 DIAGNOSIS — D649 Anemia, unspecified: Secondary | ICD-10-CM | POA: Diagnosis not present

## 2017-10-29 DIAGNOSIS — Z01818 Encounter for other preprocedural examination: Secondary | ICD-10-CM | POA: Diagnosis not present

## 2017-10-29 DIAGNOSIS — D62 Acute posthemorrhagic anemia: Secondary | ICD-10-CM | POA: Diagnosis not present

## 2017-10-29 DIAGNOSIS — E7800 Pure hypercholesterolemia, unspecified: Secondary | ICD-10-CM | POA: Diagnosis present

## 2017-10-29 DIAGNOSIS — R58 Hemorrhage, not elsewhere classified: Secondary | ICD-10-CM | POA: Diagnosis not present

## 2017-10-29 DIAGNOSIS — Z79899 Other long term (current) drug therapy: Secondary | ICD-10-CM | POA: Diagnosis not present

## 2017-10-29 DIAGNOSIS — I2510 Atherosclerotic heart disease of native coronary artery without angina pectoris: Secondary | ICD-10-CM | POA: Diagnosis not present

## 2017-10-29 DIAGNOSIS — R52 Pain, unspecified: Secondary | ICD-10-CM | POA: Diagnosis not present

## 2017-10-29 DIAGNOSIS — I7100 Dissection of unspecified site of aorta: Secondary | ICD-10-CM | POA: Diagnosis not present

## 2017-10-29 DIAGNOSIS — I10 Essential (primary) hypertension: Secondary | ICD-10-CM | POA: Diagnosis not present

## 2017-10-29 DIAGNOSIS — Z87891 Personal history of nicotine dependence: Secondary | ICD-10-CM | POA: Diagnosis not present

## 2017-10-29 DIAGNOSIS — S72001D Fracture of unspecified part of neck of right femur, subsequent encounter for closed fracture with routine healing: Secondary | ICD-10-CM | POA: Diagnosis not present

## 2017-10-29 DIAGNOSIS — Z7401 Bed confinement status: Secondary | ICD-10-CM | POA: Diagnosis not present

## 2017-10-29 DIAGNOSIS — J449 Chronic obstructive pulmonary disease, unspecified: Secondary | ICD-10-CM | POA: Diagnosis not present

## 2017-10-29 DIAGNOSIS — I739 Peripheral vascular disease, unspecified: Secondary | ICD-10-CM | POA: Diagnosis present

## 2017-10-29 DIAGNOSIS — W19XXXD Unspecified fall, subsequent encounter: Secondary | ICD-10-CM | POA: Diagnosis not present

## 2017-10-29 DIAGNOSIS — S299XXA Unspecified injury of thorax, initial encounter: Secondary | ICD-10-CM | POA: Diagnosis not present

## 2017-10-29 DIAGNOSIS — J9611 Chronic respiratory failure with hypoxia: Secondary | ICD-10-CM | POA: Diagnosis not present

## 2017-10-29 DIAGNOSIS — M84651A Pathological fracture in other disease, right femur, initial encounter for fracture: Secondary | ICD-10-CM | POA: Diagnosis not present

## 2017-10-29 DIAGNOSIS — R0902 Hypoxemia: Secondary | ICD-10-CM | POA: Diagnosis not present

## 2017-10-29 DIAGNOSIS — R000 Tachycardia, unspecified: Secondary | ICD-10-CM | POA: Diagnosis not present

## 2017-10-29 DIAGNOSIS — W19XXXA Unspecified fall, initial encounter: Secondary | ICD-10-CM | POA: Diagnosis not present

## 2017-10-29 DIAGNOSIS — J9610 Chronic respiratory failure, unspecified whether with hypoxia or hypercapnia: Secondary | ICD-10-CM | POA: Diagnosis not present

## 2017-10-29 DIAGNOSIS — D696 Thrombocytopenia, unspecified: Secondary | ICD-10-CM | POA: Diagnosis present

## 2017-10-29 DIAGNOSIS — C3490 Malignant neoplasm of unspecified part of unspecified bronchus or lung: Secondary | ICD-10-CM | POA: Diagnosis present

## 2017-10-29 DIAGNOSIS — S72001A Fracture of unspecified part of neck of right femur, initial encounter for closed fracture: Secondary | ICD-10-CM | POA: Diagnosis not present

## 2017-10-29 DIAGNOSIS — M1990 Unspecified osteoarthritis, unspecified site: Secondary | ICD-10-CM | POA: Diagnosis present

## 2017-10-29 DIAGNOSIS — I719 Aortic aneurysm of unspecified site, without rupture: Secondary | ICD-10-CM | POA: Diagnosis not present

## 2017-11-01 DIAGNOSIS — Z01818 Encounter for other preprocedural examination: Secondary | ICD-10-CM

## 2017-11-02 DIAGNOSIS — Z510 Encounter for antineoplastic radiation therapy: Secondary | ICD-10-CM | POA: Diagnosis not present

## 2017-11-02 DIAGNOSIS — D62 Acute posthemorrhagic anemia: Secondary | ICD-10-CM | POA: Diagnosis not present

## 2017-11-02 DIAGNOSIS — W19XXXA Unspecified fall, initial encounter: Secondary | ICD-10-CM | POA: Diagnosis not present

## 2017-11-02 DIAGNOSIS — Z23 Encounter for immunization: Secondary | ICD-10-CM | POA: Diagnosis not present

## 2017-11-02 DIAGNOSIS — S72001D Fracture of unspecified part of neck of right femur, subsequent encounter for closed fracture with routine healing: Secondary | ICD-10-CM | POA: Diagnosis not present

## 2017-11-02 DIAGNOSIS — S72001A Fracture of unspecified part of neck of right femur, initial encounter for closed fracture: Secondary | ICD-10-CM | POA: Diagnosis not present

## 2017-11-02 DIAGNOSIS — D696 Thrombocytopenia, unspecified: Secondary | ICD-10-CM | POA: Diagnosis not present

## 2017-11-02 DIAGNOSIS — I739 Peripheral vascular disease, unspecified: Secondary | ICD-10-CM | POA: Diagnosis not present

## 2017-11-02 DIAGNOSIS — J449 Chronic obstructive pulmonary disease, unspecified: Secondary | ICD-10-CM | POA: Diagnosis not present

## 2017-11-02 DIAGNOSIS — W19XXXD Unspecified fall, subsequent encounter: Secondary | ICD-10-CM | POA: Diagnosis not present

## 2017-11-02 DIAGNOSIS — G8918 Other acute postprocedural pain: Secondary | ICD-10-CM | POA: Diagnosis not present

## 2017-11-02 DIAGNOSIS — R0902 Hypoxemia: Secondary | ICD-10-CM | POA: Diagnosis not present

## 2017-11-02 DIAGNOSIS — I719 Aortic aneurysm of unspecified site, without rupture: Secondary | ICD-10-CM | POA: Diagnosis not present

## 2017-11-02 DIAGNOSIS — R410 Disorientation, unspecified: Secondary | ICD-10-CM | POA: Diagnosis not present

## 2017-11-02 DIAGNOSIS — Z7401 Bed confinement status: Secondary | ICD-10-CM | POA: Diagnosis not present

## 2017-11-02 DIAGNOSIS — I10 Essential (primary) hypertension: Secondary | ICD-10-CM | POA: Diagnosis not present

## 2017-11-02 DIAGNOSIS — Z85118 Personal history of other malignant neoplasm of bronchus and lung: Secondary | ICD-10-CM | POA: Diagnosis not present

## 2017-11-02 DIAGNOSIS — C3411 Malignant neoplasm of upper lobe, right bronchus or lung: Secondary | ICD-10-CM | POA: Diagnosis not present

## 2017-11-02 DIAGNOSIS — C3490 Malignant neoplasm of unspecified part of unspecified bronchus or lung: Secondary | ICD-10-CM | POA: Diagnosis not present

## 2017-11-02 DIAGNOSIS — C3431 Malignant neoplasm of lower lobe, right bronchus or lung: Secondary | ICD-10-CM | POA: Diagnosis not present

## 2017-11-02 DIAGNOSIS — R262 Difficulty in walking, not elsewhere classified: Secondary | ICD-10-CM | POA: Diagnosis not present

## 2017-11-02 DIAGNOSIS — J9610 Chronic respiratory failure, unspecified whether with hypoxia or hypercapnia: Secondary | ICD-10-CM | POA: Diagnosis not present

## 2017-11-02 DIAGNOSIS — Z5111 Encounter for antineoplastic chemotherapy: Secondary | ICD-10-CM | POA: Diagnosis not present

## 2017-11-02 DIAGNOSIS — J9611 Chronic respiratory failure with hypoxia: Secondary | ICD-10-CM | POA: Diagnosis not present

## 2017-11-02 DIAGNOSIS — I7100 Dissection of unspecified site of aorta: Secondary | ICD-10-CM | POA: Diagnosis not present

## 2017-11-02 DIAGNOSIS — E860 Dehydration: Secondary | ICD-10-CM | POA: Diagnosis not present

## 2017-11-02 DIAGNOSIS — D649 Anemia, unspecified: Secondary | ICD-10-CM | POA: Diagnosis not present

## 2017-11-02 DIAGNOSIS — C3412 Malignant neoplasm of upper lobe, left bronchus or lung: Secondary | ICD-10-CM | POA: Diagnosis not present

## 2017-11-05 DIAGNOSIS — R410 Disorientation, unspecified: Secondary | ICD-10-CM | POA: Diagnosis not present

## 2017-11-05 DIAGNOSIS — Z85118 Personal history of other malignant neoplasm of bronchus and lung: Secondary | ICD-10-CM | POA: Diagnosis not present

## 2017-11-05 DIAGNOSIS — C3411 Malignant neoplasm of upper lobe, right bronchus or lung: Secondary | ICD-10-CM | POA: Diagnosis not present

## 2017-11-05 DIAGNOSIS — C3412 Malignant neoplasm of upper lobe, left bronchus or lung: Secondary | ICD-10-CM | POA: Diagnosis not present

## 2017-11-08 DIAGNOSIS — C3490 Malignant neoplasm of unspecified part of unspecified bronchus or lung: Secondary | ICD-10-CM | POA: Diagnosis not present

## 2017-11-08 DIAGNOSIS — G8918 Other acute postprocedural pain: Secondary | ICD-10-CM | POA: Diagnosis not present

## 2017-11-08 DIAGNOSIS — R262 Difficulty in walking, not elsewhere classified: Secondary | ICD-10-CM | POA: Diagnosis not present

## 2017-11-08 DIAGNOSIS — S72001D Fracture of unspecified part of neck of right femur, subsequent encounter for closed fracture with routine healing: Secondary | ICD-10-CM | POA: Diagnosis not present

## 2017-11-09 DIAGNOSIS — C3490 Malignant neoplasm of unspecified part of unspecified bronchus or lung: Secondary | ICD-10-CM | POA: Diagnosis not present

## 2017-11-09 DIAGNOSIS — C3412 Malignant neoplasm of upper lobe, left bronchus or lung: Secondary | ICD-10-CM | POA: Diagnosis not present

## 2017-11-09 DIAGNOSIS — C3431 Malignant neoplasm of lower lobe, right bronchus or lung: Secondary | ICD-10-CM | POA: Diagnosis not present

## 2017-11-10 DIAGNOSIS — C3431 Malignant neoplasm of lower lobe, right bronchus or lung: Secondary | ICD-10-CM | POA: Diagnosis not present

## 2017-11-10 DIAGNOSIS — Z85118 Personal history of other malignant neoplasm of bronchus and lung: Secondary | ICD-10-CM | POA: Diagnosis not present

## 2017-11-10 DIAGNOSIS — C3412 Malignant neoplasm of upper lobe, left bronchus or lung: Secondary | ICD-10-CM | POA: Diagnosis not present

## 2017-11-12 DIAGNOSIS — C3412 Malignant neoplasm of upper lobe, left bronchus or lung: Secondary | ICD-10-CM | POA: Diagnosis not present

## 2017-11-17 DIAGNOSIS — Z5111 Encounter for antineoplastic chemotherapy: Secondary | ICD-10-CM | POA: Diagnosis not present

## 2017-11-17 DIAGNOSIS — C3490 Malignant neoplasm of unspecified part of unspecified bronchus or lung: Secondary | ICD-10-CM | POA: Diagnosis not present

## 2017-11-17 DIAGNOSIS — C3412 Malignant neoplasm of upper lobe, left bronchus or lung: Secondary | ICD-10-CM | POA: Diagnosis not present

## 2017-11-18 DIAGNOSIS — Z5111 Encounter for antineoplastic chemotherapy: Secondary | ICD-10-CM | POA: Diagnosis not present

## 2017-11-18 DIAGNOSIS — Z510 Encounter for antineoplastic radiation therapy: Secondary | ICD-10-CM | POA: Diagnosis not present

## 2017-11-18 DIAGNOSIS — C3412 Malignant neoplasm of upper lobe, left bronchus or lung: Secondary | ICD-10-CM | POA: Diagnosis not present

## 2017-11-19 DIAGNOSIS — C3412 Malignant neoplasm of upper lobe, left bronchus or lung: Secondary | ICD-10-CM | POA: Diagnosis not present

## 2017-11-19 DIAGNOSIS — Z5111 Encounter for antineoplastic chemotherapy: Secondary | ICD-10-CM | POA: Diagnosis not present

## 2017-11-19 DIAGNOSIS — Z23 Encounter for immunization: Secondary | ICD-10-CM | POA: Diagnosis not present

## 2017-11-19 DIAGNOSIS — C3431 Malignant neoplasm of lower lobe, right bronchus or lung: Secondary | ICD-10-CM | POA: Diagnosis not present

## 2017-11-22 DIAGNOSIS — F17220 Nicotine dependence, chewing tobacco, uncomplicated: Secondary | ICD-10-CM | POA: Diagnosis not present

## 2017-11-22 DIAGNOSIS — C3490 Malignant neoplasm of unspecified part of unspecified bronchus or lung: Secondary | ICD-10-CM | POA: Diagnosis not present

## 2017-11-22 DIAGNOSIS — I10 Essential (primary) hypertension: Secondary | ICD-10-CM | POA: Diagnosis not present

## 2017-11-22 DIAGNOSIS — C3492 Malignant neoplasm of unspecified part of left bronchus or lung: Secondary | ICD-10-CM | POA: Diagnosis not present

## 2017-11-22 DIAGNOSIS — Z79899 Other long term (current) drug therapy: Secondary | ICD-10-CM | POA: Diagnosis not present

## 2017-11-22 DIAGNOSIS — Z7951 Long term (current) use of inhaled steroids: Secondary | ICD-10-CM | POA: Diagnosis not present

## 2017-11-22 DIAGNOSIS — Z452 Encounter for adjustment and management of vascular access device: Secondary | ICD-10-CM | POA: Diagnosis not present

## 2017-11-22 DIAGNOSIS — Z7982 Long term (current) use of aspirin: Secondary | ICD-10-CM | POA: Diagnosis not present

## 2017-11-22 DIAGNOSIS — I739 Peripheral vascular disease, unspecified: Secondary | ICD-10-CM | POA: Diagnosis not present

## 2017-11-22 DIAGNOSIS — S72001D Fracture of unspecified part of neck of right femur, subsequent encounter for closed fracture with routine healing: Secondary | ICD-10-CM | POA: Diagnosis not present

## 2017-11-22 DIAGNOSIS — L89612 Pressure ulcer of right heel, stage 2: Secondary | ICD-10-CM | POA: Diagnosis not present

## 2017-11-22 DIAGNOSIS — E119 Type 2 diabetes mellitus without complications: Secondary | ICD-10-CM | POA: Diagnosis not present

## 2017-11-22 DIAGNOSIS — J449 Chronic obstructive pulmonary disease, unspecified: Secondary | ICD-10-CM | POA: Diagnosis not present

## 2017-11-22 DIAGNOSIS — E7800 Pure hypercholesterolemia, unspecified: Secondary | ICD-10-CM | POA: Diagnosis not present

## 2017-11-22 DIAGNOSIS — I714 Abdominal aortic aneurysm, without rupture: Secondary | ICD-10-CM | POA: Diagnosis not present

## 2017-11-23 DIAGNOSIS — S72001D Fracture of unspecified part of neck of right femur, subsequent encounter for closed fracture with routine healing: Secondary | ICD-10-CM | POA: Diagnosis not present

## 2017-11-23 DIAGNOSIS — L89612 Pressure ulcer of right heel, stage 2: Secondary | ICD-10-CM | POA: Diagnosis not present

## 2017-11-23 DIAGNOSIS — C3490 Malignant neoplasm of unspecified part of unspecified bronchus or lung: Secondary | ICD-10-CM | POA: Diagnosis not present

## 2017-11-23 DIAGNOSIS — J449 Chronic obstructive pulmonary disease, unspecified: Secondary | ICD-10-CM | POA: Diagnosis not present

## 2017-11-23 DIAGNOSIS — I10 Essential (primary) hypertension: Secondary | ICD-10-CM | POA: Diagnosis not present

## 2017-11-23 DIAGNOSIS — I739 Peripheral vascular disease, unspecified: Secondary | ICD-10-CM | POA: Diagnosis not present

## 2017-11-25 DIAGNOSIS — L89612 Pressure ulcer of right heel, stage 2: Secondary | ICD-10-CM | POA: Diagnosis not present

## 2017-11-25 DIAGNOSIS — C3490 Malignant neoplasm of unspecified part of unspecified bronchus or lung: Secondary | ICD-10-CM | POA: Diagnosis not present

## 2017-11-25 DIAGNOSIS — J449 Chronic obstructive pulmonary disease, unspecified: Secondary | ICD-10-CM | POA: Diagnosis not present

## 2017-11-25 DIAGNOSIS — I10 Essential (primary) hypertension: Secondary | ICD-10-CM | POA: Diagnosis not present

## 2017-11-25 DIAGNOSIS — S72001D Fracture of unspecified part of neck of right femur, subsequent encounter for closed fracture with routine healing: Secondary | ICD-10-CM | POA: Diagnosis not present

## 2017-11-25 DIAGNOSIS — I739 Peripheral vascular disease, unspecified: Secondary | ICD-10-CM | POA: Diagnosis not present

## 2017-11-26 DIAGNOSIS — T451X5A Adverse effect of antineoplastic and immunosuppressive drugs, initial encounter: Secondary | ICD-10-CM | POA: Diagnosis present

## 2017-11-26 DIAGNOSIS — D61810 Antineoplastic chemotherapy induced pancytopenia: Secondary | ICD-10-CM | POA: Diagnosis present

## 2017-11-26 DIAGNOSIS — R000 Tachycardia, unspecified: Secondary | ICD-10-CM | POA: Diagnosis present

## 2017-11-26 DIAGNOSIS — C3412 Malignant neoplasm of upper lobe, left bronchus or lung: Secondary | ICD-10-CM | POA: Diagnosis not present

## 2017-11-26 DIAGNOSIS — D701 Agranulocytosis secondary to cancer chemotherapy: Secondary | ICD-10-CM | POA: Diagnosis not present

## 2017-11-26 DIAGNOSIS — Z7982 Long term (current) use of aspirin: Secondary | ICD-10-CM | POA: Diagnosis not present

## 2017-11-26 DIAGNOSIS — L97419 Non-pressure chronic ulcer of right heel and midfoot with unspecified severity: Secondary | ICD-10-CM | POA: Diagnosis present

## 2017-11-26 DIAGNOSIS — E860 Dehydration: Secondary | ICD-10-CM | POA: Diagnosis present

## 2017-11-26 DIAGNOSIS — Z79899 Other long term (current) drug therapy: Secondary | ICD-10-CM | POA: Diagnosis not present

## 2017-11-26 DIAGNOSIS — R300 Dysuria: Secondary | ICD-10-CM | POA: Diagnosis present

## 2017-11-26 DIAGNOSIS — Z9981 Dependence on supplemental oxygen: Secondary | ICD-10-CM | POA: Diagnosis not present

## 2017-11-26 DIAGNOSIS — Z87891 Personal history of nicotine dependence: Secondary | ICD-10-CM | POA: Diagnosis not present

## 2017-11-26 DIAGNOSIS — I10 Essential (primary) hypertension: Secondary | ICD-10-CM | POA: Diagnosis present

## 2017-11-26 DIAGNOSIS — J449 Chronic obstructive pulmonary disease, unspecified: Secondary | ICD-10-CM | POA: Diagnosis present

## 2017-11-26 DIAGNOSIS — C3482 Malignant neoplasm of overlapping sites of left bronchus and lung: Secondary | ICD-10-CM | POA: Diagnosis present

## 2017-11-26 DIAGNOSIS — D709 Neutropenia, unspecified: Secondary | ICD-10-CM | POA: Diagnosis present

## 2017-11-26 DIAGNOSIS — I959 Hypotension, unspecified: Secondary | ICD-10-CM | POA: Diagnosis present

## 2017-11-26 DIAGNOSIS — R8271 Bacteriuria: Secondary | ICD-10-CM | POA: Diagnosis present

## 2017-11-26 DIAGNOSIS — R5081 Fever presenting with conditions classified elsewhere: Secondary | ICD-10-CM | POA: Diagnosis present

## 2017-11-28 DIAGNOSIS — C3412 Malignant neoplasm of upper lobe, left bronchus or lung: Secondary | ICD-10-CM

## 2017-11-28 DIAGNOSIS — D701 Agranulocytosis secondary to cancer chemotherapy: Secondary | ICD-10-CM

## 2017-11-29 DIAGNOSIS — C3412 Malignant neoplasm of upper lobe, left bronchus or lung: Secondary | ICD-10-CM | POA: Diagnosis not present

## 2017-11-30 DIAGNOSIS — C3490 Malignant neoplasm of unspecified part of unspecified bronchus or lung: Secondary | ICD-10-CM | POA: Diagnosis not present

## 2017-11-30 DIAGNOSIS — R2681 Unsteadiness on feet: Secondary | ICD-10-CM | POA: Diagnosis not present

## 2017-11-30 DIAGNOSIS — F419 Anxiety disorder, unspecified: Secondary | ICD-10-CM | POA: Diagnosis not present

## 2017-11-30 DIAGNOSIS — S72001D Fracture of unspecified part of neck of right femur, subsequent encounter for closed fracture with routine healing: Secondary | ICD-10-CM | POA: Diagnosis not present

## 2017-11-30 DIAGNOSIS — J449 Chronic obstructive pulmonary disease, unspecified: Secondary | ICD-10-CM | POA: Diagnosis not present

## 2017-11-30 DIAGNOSIS — L89612 Pressure ulcer of right heel, stage 2: Secondary | ICD-10-CM | POA: Diagnosis not present

## 2017-11-30 DIAGNOSIS — Z7982 Long term (current) use of aspirin: Secondary | ICD-10-CM | POA: Diagnosis not present

## 2017-11-30 DIAGNOSIS — I739 Peripheral vascular disease, unspecified: Secondary | ICD-10-CM | POA: Diagnosis not present

## 2017-11-30 DIAGNOSIS — I10 Essential (primary) hypertension: Secondary | ICD-10-CM | POA: Diagnosis not present

## 2017-12-02 DIAGNOSIS — R3129 Other microscopic hematuria: Secondary | ICD-10-CM | POA: Diagnosis not present

## 2017-12-02 DIAGNOSIS — J410 Simple chronic bronchitis: Secondary | ICD-10-CM | POA: Diagnosis not present

## 2017-12-02 DIAGNOSIS — E860 Dehydration: Secondary | ICD-10-CM | POA: Diagnosis not present

## 2017-12-02 DIAGNOSIS — N3001 Acute cystitis with hematuria: Secondary | ICD-10-CM | POA: Diagnosis not present

## 2017-12-02 DIAGNOSIS — I9589 Other hypotension: Secondary | ICD-10-CM | POA: Diagnosis not present

## 2017-12-02 DIAGNOSIS — M6281 Muscle weakness (generalized): Secondary | ICD-10-CM | POA: Diagnosis not present

## 2017-12-02 DIAGNOSIS — D61810 Antineoplastic chemotherapy induced pancytopenia: Secondary | ICD-10-CM | POA: Diagnosis not present

## 2017-12-02 DIAGNOSIS — J9611 Chronic respiratory failure with hypoxia: Secondary | ICD-10-CM | POA: Diagnosis not present

## 2017-12-02 DIAGNOSIS — M25551 Pain in right hip: Secondary | ICD-10-CM | POA: Diagnosis not present

## 2017-12-03 DIAGNOSIS — I739 Peripheral vascular disease, unspecified: Secondary | ICD-10-CM | POA: Diagnosis not present

## 2017-12-03 DIAGNOSIS — C3490 Malignant neoplasm of unspecified part of unspecified bronchus or lung: Secondary | ICD-10-CM | POA: Diagnosis not present

## 2017-12-03 DIAGNOSIS — L89612 Pressure ulcer of right heel, stage 2: Secondary | ICD-10-CM | POA: Diagnosis not present

## 2017-12-03 DIAGNOSIS — S72001D Fracture of unspecified part of neck of right femur, subsequent encounter for closed fracture with routine healing: Secondary | ICD-10-CM | POA: Diagnosis not present

## 2017-12-03 DIAGNOSIS — J449 Chronic obstructive pulmonary disease, unspecified: Secondary | ICD-10-CM | POA: Diagnosis not present

## 2017-12-03 DIAGNOSIS — I10 Essential (primary) hypertension: Secondary | ICD-10-CM | POA: Diagnosis not present

## 2017-12-06 DIAGNOSIS — J449 Chronic obstructive pulmonary disease, unspecified: Secondary | ICD-10-CM | POA: Diagnosis not present

## 2017-12-06 DIAGNOSIS — S72001D Fracture of unspecified part of neck of right femur, subsequent encounter for closed fracture with routine healing: Secondary | ICD-10-CM | POA: Diagnosis not present

## 2017-12-06 DIAGNOSIS — L89612 Pressure ulcer of right heel, stage 2: Secondary | ICD-10-CM | POA: Diagnosis not present

## 2017-12-06 DIAGNOSIS — C3490 Malignant neoplasm of unspecified part of unspecified bronchus or lung: Secondary | ICD-10-CM | POA: Diagnosis not present

## 2017-12-06 DIAGNOSIS — I10 Essential (primary) hypertension: Secondary | ICD-10-CM | POA: Diagnosis not present

## 2017-12-06 DIAGNOSIS — I739 Peripheral vascular disease, unspecified: Secondary | ICD-10-CM | POA: Diagnosis not present

## 2017-12-07 DIAGNOSIS — L89612 Pressure ulcer of right heel, stage 2: Secondary | ICD-10-CM | POA: Diagnosis not present

## 2017-12-07 DIAGNOSIS — I10 Essential (primary) hypertension: Secondary | ICD-10-CM | POA: Diagnosis not present

## 2017-12-07 DIAGNOSIS — J449 Chronic obstructive pulmonary disease, unspecified: Secondary | ICD-10-CM | POA: Diagnosis not present

## 2017-12-07 DIAGNOSIS — C3490 Malignant neoplasm of unspecified part of unspecified bronchus or lung: Secondary | ICD-10-CM | POA: Diagnosis not present

## 2017-12-07 DIAGNOSIS — C3431 Malignant neoplasm of lower lobe, right bronchus or lung: Secondary | ICD-10-CM | POA: Diagnosis not present

## 2017-12-07 DIAGNOSIS — S72001D Fracture of unspecified part of neck of right femur, subsequent encounter for closed fracture with routine healing: Secondary | ICD-10-CM | POA: Diagnosis not present

## 2017-12-07 DIAGNOSIS — Z510 Encounter for antineoplastic radiation therapy: Secondary | ICD-10-CM | POA: Diagnosis not present

## 2017-12-07 DIAGNOSIS — I739 Peripheral vascular disease, unspecified: Secondary | ICD-10-CM | POA: Diagnosis not present

## 2017-12-07 DIAGNOSIS — C3412 Malignant neoplasm of upper lobe, left bronchus or lung: Secondary | ICD-10-CM | POA: Diagnosis not present

## 2017-12-08 DIAGNOSIS — C3412 Malignant neoplasm of upper lobe, left bronchus or lung: Secondary | ICD-10-CM | POA: Diagnosis not present

## 2017-12-08 DIAGNOSIS — J449 Chronic obstructive pulmonary disease, unspecified: Secondary | ICD-10-CM | POA: Diagnosis not present

## 2017-12-08 DIAGNOSIS — S72001D Fracture of unspecified part of neck of right femur, subsequent encounter for closed fracture with routine healing: Secondary | ICD-10-CM | POA: Diagnosis not present

## 2017-12-08 DIAGNOSIS — C3490 Malignant neoplasm of unspecified part of unspecified bronchus or lung: Secondary | ICD-10-CM | POA: Diagnosis not present

## 2017-12-08 DIAGNOSIS — L89612 Pressure ulcer of right heel, stage 2: Secondary | ICD-10-CM | POA: Diagnosis not present

## 2017-12-08 DIAGNOSIS — Z85118 Personal history of other malignant neoplasm of bronchus and lung: Secondary | ICD-10-CM | POA: Diagnosis not present

## 2017-12-08 DIAGNOSIS — C3431 Malignant neoplasm of lower lobe, right bronchus or lung: Secondary | ICD-10-CM | POA: Diagnosis not present

## 2017-12-08 DIAGNOSIS — I739 Peripheral vascular disease, unspecified: Secondary | ICD-10-CM | POA: Diagnosis not present

## 2017-12-08 DIAGNOSIS — I10 Essential (primary) hypertension: Secondary | ICD-10-CM | POA: Diagnosis not present

## 2017-12-09 DIAGNOSIS — S72001D Fracture of unspecified part of neck of right femur, subsequent encounter for closed fracture with routine healing: Secondary | ICD-10-CM | POA: Diagnosis not present

## 2017-12-09 DIAGNOSIS — C3412 Malignant neoplasm of upper lobe, left bronchus or lung: Secondary | ICD-10-CM | POA: Diagnosis not present

## 2017-12-09 DIAGNOSIS — J449 Chronic obstructive pulmonary disease, unspecified: Secondary | ICD-10-CM | POA: Diagnosis not present

## 2017-12-09 DIAGNOSIS — Z510 Encounter for antineoplastic radiation therapy: Secondary | ICD-10-CM | POA: Diagnosis not present

## 2017-12-09 DIAGNOSIS — L89612 Pressure ulcer of right heel, stage 2: Secondary | ICD-10-CM | POA: Diagnosis not present

## 2017-12-09 DIAGNOSIS — I10 Essential (primary) hypertension: Secondary | ICD-10-CM | POA: Diagnosis not present

## 2017-12-09 DIAGNOSIS — C3490 Malignant neoplasm of unspecified part of unspecified bronchus or lung: Secondary | ICD-10-CM | POA: Diagnosis not present

## 2017-12-09 DIAGNOSIS — I739 Peripheral vascular disease, unspecified: Secondary | ICD-10-CM | POA: Diagnosis not present

## 2017-12-09 DIAGNOSIS — C3431 Malignant neoplasm of lower lobe, right bronchus or lung: Secondary | ICD-10-CM | POA: Diagnosis not present

## 2017-12-10 DIAGNOSIS — C3431 Malignant neoplasm of lower lobe, right bronchus or lung: Secondary | ICD-10-CM | POA: Diagnosis not present

## 2017-12-10 DIAGNOSIS — J449 Chronic obstructive pulmonary disease, unspecified: Secondary | ICD-10-CM | POA: Diagnosis not present

## 2017-12-10 DIAGNOSIS — I739 Peripheral vascular disease, unspecified: Secondary | ICD-10-CM | POA: Diagnosis not present

## 2017-12-10 DIAGNOSIS — Z96649 Presence of unspecified artificial hip joint: Secondary | ICD-10-CM | POA: Diagnosis not present

## 2017-12-10 DIAGNOSIS — L89612 Pressure ulcer of right heel, stage 2: Secondary | ICD-10-CM | POA: Diagnosis not present

## 2017-12-10 DIAGNOSIS — S72001D Fracture of unspecified part of neck of right femur, subsequent encounter for closed fracture with routine healing: Secondary | ICD-10-CM | POA: Diagnosis not present

## 2017-12-10 DIAGNOSIS — I10 Essential (primary) hypertension: Secondary | ICD-10-CM | POA: Diagnosis not present

## 2017-12-10 DIAGNOSIS — Z510 Encounter for antineoplastic radiation therapy: Secondary | ICD-10-CM | POA: Diagnosis not present

## 2017-12-10 DIAGNOSIS — C3412 Malignant neoplasm of upper lobe, left bronchus or lung: Secondary | ICD-10-CM | POA: Diagnosis not present

## 2017-12-10 DIAGNOSIS — C3490 Malignant neoplasm of unspecified part of unspecified bronchus or lung: Secondary | ICD-10-CM | POA: Diagnosis not present

## 2017-12-13 DIAGNOSIS — C3490 Malignant neoplasm of unspecified part of unspecified bronchus or lung: Secondary | ICD-10-CM | POA: Diagnosis not present

## 2017-12-13 DIAGNOSIS — C3412 Malignant neoplasm of upper lobe, left bronchus or lung: Secondary | ICD-10-CM | POA: Diagnosis not present

## 2017-12-13 DIAGNOSIS — L89612 Pressure ulcer of right heel, stage 2: Secondary | ICD-10-CM | POA: Diagnosis not present

## 2017-12-13 DIAGNOSIS — I739 Peripheral vascular disease, unspecified: Secondary | ICD-10-CM | POA: Diagnosis not present

## 2017-12-13 DIAGNOSIS — S72001D Fracture of unspecified part of neck of right femur, subsequent encounter for closed fracture with routine healing: Secondary | ICD-10-CM | POA: Diagnosis not present

## 2017-12-13 DIAGNOSIS — C3431 Malignant neoplasm of lower lobe, right bronchus or lung: Secondary | ICD-10-CM | POA: Diagnosis not present

## 2017-12-13 DIAGNOSIS — Z510 Encounter for antineoplastic radiation therapy: Secondary | ICD-10-CM | POA: Diagnosis not present

## 2017-12-13 DIAGNOSIS — J449 Chronic obstructive pulmonary disease, unspecified: Secondary | ICD-10-CM | POA: Diagnosis not present

## 2017-12-13 DIAGNOSIS — I10 Essential (primary) hypertension: Secondary | ICD-10-CM | POA: Diagnosis not present

## 2017-12-14 DIAGNOSIS — C3431 Malignant neoplasm of lower lobe, right bronchus or lung: Secondary | ICD-10-CM | POA: Diagnosis not present

## 2017-12-14 DIAGNOSIS — J449 Chronic obstructive pulmonary disease, unspecified: Secondary | ICD-10-CM | POA: Diagnosis not present

## 2017-12-14 DIAGNOSIS — I10 Essential (primary) hypertension: Secondary | ICD-10-CM | POA: Diagnosis not present

## 2017-12-14 DIAGNOSIS — C3412 Malignant neoplasm of upper lobe, left bronchus or lung: Secondary | ICD-10-CM | POA: Diagnosis not present

## 2017-12-14 DIAGNOSIS — C3490 Malignant neoplasm of unspecified part of unspecified bronchus or lung: Secondary | ICD-10-CM | POA: Diagnosis not present

## 2017-12-14 DIAGNOSIS — L89612 Pressure ulcer of right heel, stage 2: Secondary | ICD-10-CM | POA: Diagnosis not present

## 2017-12-14 DIAGNOSIS — Z510 Encounter for antineoplastic radiation therapy: Secondary | ICD-10-CM | POA: Diagnosis not present

## 2017-12-14 DIAGNOSIS — S72001D Fracture of unspecified part of neck of right femur, subsequent encounter for closed fracture with routine healing: Secondary | ICD-10-CM | POA: Diagnosis not present

## 2017-12-14 DIAGNOSIS — I739 Peripheral vascular disease, unspecified: Secondary | ICD-10-CM | POA: Diagnosis not present

## 2017-12-15 DIAGNOSIS — J449 Chronic obstructive pulmonary disease, unspecified: Secondary | ICD-10-CM | POA: Diagnosis not present

## 2017-12-15 DIAGNOSIS — C3412 Malignant neoplasm of upper lobe, left bronchus or lung: Secondary | ICD-10-CM | POA: Diagnosis not present

## 2017-12-15 DIAGNOSIS — C3490 Malignant neoplasm of unspecified part of unspecified bronchus or lung: Secondary | ICD-10-CM | POA: Diagnosis not present

## 2017-12-15 DIAGNOSIS — Z510 Encounter for antineoplastic radiation therapy: Secondary | ICD-10-CM | POA: Diagnosis not present

## 2017-12-15 DIAGNOSIS — S72001D Fracture of unspecified part of neck of right femur, subsequent encounter for closed fracture with routine healing: Secondary | ICD-10-CM | POA: Diagnosis not present

## 2017-12-15 DIAGNOSIS — L89612 Pressure ulcer of right heel, stage 2: Secondary | ICD-10-CM | POA: Diagnosis not present

## 2017-12-15 DIAGNOSIS — C3431 Malignant neoplasm of lower lobe, right bronchus or lung: Secondary | ICD-10-CM | POA: Diagnosis not present

## 2017-12-15 DIAGNOSIS — I739 Peripheral vascular disease, unspecified: Secondary | ICD-10-CM | POA: Diagnosis not present

## 2017-12-15 DIAGNOSIS — I10 Essential (primary) hypertension: Secondary | ICD-10-CM | POA: Diagnosis not present

## 2017-12-16 DIAGNOSIS — I10 Essential (primary) hypertension: Secondary | ICD-10-CM | POA: Diagnosis not present

## 2017-12-16 DIAGNOSIS — L89612 Pressure ulcer of right heel, stage 2: Secondary | ICD-10-CM | POA: Diagnosis not present

## 2017-12-16 DIAGNOSIS — Z510 Encounter for antineoplastic radiation therapy: Secondary | ICD-10-CM | POA: Diagnosis not present

## 2017-12-16 DIAGNOSIS — S72001D Fracture of unspecified part of neck of right femur, subsequent encounter for closed fracture with routine healing: Secondary | ICD-10-CM | POA: Diagnosis not present

## 2017-12-16 DIAGNOSIS — J449 Chronic obstructive pulmonary disease, unspecified: Secondary | ICD-10-CM | POA: Diagnosis not present

## 2017-12-16 DIAGNOSIS — I739 Peripheral vascular disease, unspecified: Secondary | ICD-10-CM | POA: Diagnosis not present

## 2017-12-16 DIAGNOSIS — C3490 Malignant neoplasm of unspecified part of unspecified bronchus or lung: Secondary | ICD-10-CM | POA: Diagnosis not present

## 2017-12-16 DIAGNOSIS — C3412 Malignant neoplasm of upper lobe, left bronchus or lung: Secondary | ICD-10-CM | POA: Diagnosis not present

## 2017-12-16 DIAGNOSIS — C3431 Malignant neoplasm of lower lobe, right bronchus or lung: Secondary | ICD-10-CM | POA: Diagnosis not present

## 2017-12-17 DIAGNOSIS — I739 Peripheral vascular disease, unspecified: Secondary | ICD-10-CM | POA: Diagnosis not present

## 2017-12-17 DIAGNOSIS — S72001D Fracture of unspecified part of neck of right femur, subsequent encounter for closed fracture with routine healing: Secondary | ICD-10-CM | POA: Diagnosis not present

## 2017-12-17 DIAGNOSIS — I10 Essential (primary) hypertension: Secondary | ICD-10-CM | POA: Diagnosis not present

## 2017-12-17 DIAGNOSIS — Z510 Encounter for antineoplastic radiation therapy: Secondary | ICD-10-CM | POA: Diagnosis not present

## 2017-12-17 DIAGNOSIS — C3412 Malignant neoplasm of upper lobe, left bronchus or lung: Secondary | ICD-10-CM | POA: Diagnosis not present

## 2017-12-17 DIAGNOSIS — L89612 Pressure ulcer of right heel, stage 2: Secondary | ICD-10-CM | POA: Diagnosis not present

## 2017-12-17 DIAGNOSIS — C3490 Malignant neoplasm of unspecified part of unspecified bronchus or lung: Secondary | ICD-10-CM | POA: Diagnosis not present

## 2017-12-17 DIAGNOSIS — C3431 Malignant neoplasm of lower lobe, right bronchus or lung: Secondary | ICD-10-CM | POA: Diagnosis not present

## 2017-12-17 DIAGNOSIS — J449 Chronic obstructive pulmonary disease, unspecified: Secondary | ICD-10-CM | POA: Diagnosis not present

## 2017-12-20 DIAGNOSIS — J449 Chronic obstructive pulmonary disease, unspecified: Secondary | ICD-10-CM | POA: Diagnosis not present

## 2017-12-20 DIAGNOSIS — I739 Peripheral vascular disease, unspecified: Secondary | ICD-10-CM | POA: Diagnosis not present

## 2017-12-20 DIAGNOSIS — C3412 Malignant neoplasm of upper lobe, left bronchus or lung: Secondary | ICD-10-CM | POA: Diagnosis not present

## 2017-12-20 DIAGNOSIS — C3490 Malignant neoplasm of unspecified part of unspecified bronchus or lung: Secondary | ICD-10-CM | POA: Diagnosis not present

## 2017-12-20 DIAGNOSIS — L89612 Pressure ulcer of right heel, stage 2: Secondary | ICD-10-CM | POA: Diagnosis not present

## 2017-12-20 DIAGNOSIS — Z510 Encounter for antineoplastic radiation therapy: Secondary | ICD-10-CM | POA: Diagnosis not present

## 2017-12-20 DIAGNOSIS — C3431 Malignant neoplasm of lower lobe, right bronchus or lung: Secondary | ICD-10-CM | POA: Diagnosis not present

## 2017-12-20 DIAGNOSIS — I10 Essential (primary) hypertension: Secondary | ICD-10-CM | POA: Diagnosis not present

## 2017-12-20 DIAGNOSIS — S72001D Fracture of unspecified part of neck of right femur, subsequent encounter for closed fracture with routine healing: Secondary | ICD-10-CM | POA: Diagnosis not present

## 2017-12-21 DIAGNOSIS — C3412 Malignant neoplasm of upper lobe, left bronchus or lung: Secondary | ICD-10-CM | POA: Diagnosis not present

## 2017-12-21 DIAGNOSIS — C3431 Malignant neoplasm of lower lobe, right bronchus or lung: Secondary | ICD-10-CM | POA: Diagnosis not present

## 2017-12-21 DIAGNOSIS — Z510 Encounter for antineoplastic radiation therapy: Secondary | ICD-10-CM | POA: Diagnosis not present

## 2017-12-22 DIAGNOSIS — S72001D Fracture of unspecified part of neck of right femur, subsequent encounter for closed fracture with routine healing: Secondary | ICD-10-CM | POA: Diagnosis not present

## 2017-12-22 DIAGNOSIS — I739 Peripheral vascular disease, unspecified: Secondary | ICD-10-CM | POA: Diagnosis not present

## 2017-12-22 DIAGNOSIS — L89612 Pressure ulcer of right heel, stage 2: Secondary | ICD-10-CM | POA: Diagnosis not present

## 2017-12-22 DIAGNOSIS — J449 Chronic obstructive pulmonary disease, unspecified: Secondary | ICD-10-CM | POA: Diagnosis not present

## 2017-12-22 DIAGNOSIS — I10 Essential (primary) hypertension: Secondary | ICD-10-CM | POA: Diagnosis not present

## 2017-12-22 DIAGNOSIS — C3490 Malignant neoplasm of unspecified part of unspecified bronchus or lung: Secondary | ICD-10-CM | POA: Diagnosis not present

## 2017-12-23 DIAGNOSIS — C3412 Malignant neoplasm of upper lobe, left bronchus or lung: Secondary | ICD-10-CM | POA: Diagnosis not present

## 2017-12-23 DIAGNOSIS — C3431 Malignant neoplasm of lower lobe, right bronchus or lung: Secondary | ICD-10-CM | POA: Diagnosis not present

## 2017-12-23 DIAGNOSIS — Z510 Encounter for antineoplastic radiation therapy: Secondary | ICD-10-CM | POA: Diagnosis not present

## 2017-12-24 DIAGNOSIS — C3412 Malignant neoplasm of upper lobe, left bronchus or lung: Secondary | ICD-10-CM | POA: Diagnosis not present

## 2017-12-24 DIAGNOSIS — C3431 Malignant neoplasm of lower lobe, right bronchus or lung: Secondary | ICD-10-CM | POA: Diagnosis not present

## 2017-12-25 DIAGNOSIS — I10 Essential (primary) hypertension: Secondary | ICD-10-CM | POA: Diagnosis not present

## 2017-12-25 DIAGNOSIS — I739 Peripheral vascular disease, unspecified: Secondary | ICD-10-CM | POA: Diagnosis not present

## 2017-12-25 DIAGNOSIS — J449 Chronic obstructive pulmonary disease, unspecified: Secondary | ICD-10-CM | POA: Diagnosis not present

## 2017-12-25 DIAGNOSIS — C3490 Malignant neoplasm of unspecified part of unspecified bronchus or lung: Secondary | ICD-10-CM | POA: Diagnosis not present

## 2017-12-25 DIAGNOSIS — L89612 Pressure ulcer of right heel, stage 2: Secondary | ICD-10-CM | POA: Diagnosis not present

## 2017-12-25 DIAGNOSIS — S72001D Fracture of unspecified part of neck of right femur, subsequent encounter for closed fracture with routine healing: Secondary | ICD-10-CM | POA: Diagnosis not present

## 2017-12-27 DIAGNOSIS — S72001D Fracture of unspecified part of neck of right femur, subsequent encounter for closed fracture with routine healing: Secondary | ICD-10-CM | POA: Diagnosis not present

## 2017-12-27 DIAGNOSIS — Z510 Encounter for antineoplastic radiation therapy: Secondary | ICD-10-CM | POA: Diagnosis not present

## 2017-12-27 DIAGNOSIS — L89612 Pressure ulcer of right heel, stage 2: Secondary | ICD-10-CM | POA: Diagnosis not present

## 2017-12-27 DIAGNOSIS — I10 Essential (primary) hypertension: Secondary | ICD-10-CM | POA: Diagnosis not present

## 2017-12-27 DIAGNOSIS — C3431 Malignant neoplasm of lower lobe, right bronchus or lung: Secondary | ICD-10-CM | POA: Diagnosis not present

## 2017-12-27 DIAGNOSIS — C3412 Malignant neoplasm of upper lobe, left bronchus or lung: Secondary | ICD-10-CM | POA: Diagnosis not present

## 2017-12-27 DIAGNOSIS — J449 Chronic obstructive pulmonary disease, unspecified: Secondary | ICD-10-CM | POA: Diagnosis not present

## 2017-12-27 DIAGNOSIS — I739 Peripheral vascular disease, unspecified: Secondary | ICD-10-CM | POA: Diagnosis not present

## 2017-12-27 DIAGNOSIS — C3490 Malignant neoplasm of unspecified part of unspecified bronchus or lung: Secondary | ICD-10-CM | POA: Diagnosis not present

## 2017-12-28 DIAGNOSIS — S72001D Fracture of unspecified part of neck of right femur, subsequent encounter for closed fracture with routine healing: Secondary | ICD-10-CM | POA: Diagnosis not present

## 2017-12-28 DIAGNOSIS — C3412 Malignant neoplasm of upper lobe, left bronchus or lung: Secondary | ICD-10-CM | POA: Diagnosis not present

## 2017-12-28 DIAGNOSIS — C3490 Malignant neoplasm of unspecified part of unspecified bronchus or lung: Secondary | ICD-10-CM | POA: Diagnosis not present

## 2017-12-28 DIAGNOSIS — I739 Peripheral vascular disease, unspecified: Secondary | ICD-10-CM | POA: Diagnosis not present

## 2017-12-28 DIAGNOSIS — I10 Essential (primary) hypertension: Secondary | ICD-10-CM | POA: Diagnosis not present

## 2017-12-28 DIAGNOSIS — Z510 Encounter for antineoplastic radiation therapy: Secondary | ICD-10-CM | POA: Diagnosis not present

## 2017-12-28 DIAGNOSIS — C3431 Malignant neoplasm of lower lobe, right bronchus or lung: Secondary | ICD-10-CM | POA: Diagnosis not present

## 2017-12-28 DIAGNOSIS — J449 Chronic obstructive pulmonary disease, unspecified: Secondary | ICD-10-CM | POA: Diagnosis not present

## 2017-12-28 DIAGNOSIS — L89612 Pressure ulcer of right heel, stage 2: Secondary | ICD-10-CM | POA: Diagnosis not present

## 2017-12-29 DIAGNOSIS — C3412 Malignant neoplasm of upper lobe, left bronchus or lung: Secondary | ICD-10-CM | POA: Diagnosis not present

## 2017-12-29 DIAGNOSIS — I739 Peripheral vascular disease, unspecified: Secondary | ICD-10-CM | POA: Diagnosis not present

## 2017-12-29 DIAGNOSIS — C3431 Malignant neoplasm of lower lobe, right bronchus or lung: Secondary | ICD-10-CM | POA: Diagnosis not present

## 2017-12-29 DIAGNOSIS — Z510 Encounter for antineoplastic radiation therapy: Secondary | ICD-10-CM | POA: Diagnosis not present

## 2017-12-29 DIAGNOSIS — S72001D Fracture of unspecified part of neck of right femur, subsequent encounter for closed fracture with routine healing: Secondary | ICD-10-CM | POA: Diagnosis not present

## 2017-12-29 DIAGNOSIS — I10 Essential (primary) hypertension: Secondary | ICD-10-CM | POA: Diagnosis not present

## 2017-12-29 DIAGNOSIS — J449 Chronic obstructive pulmonary disease, unspecified: Secondary | ICD-10-CM | POA: Diagnosis not present

## 2017-12-29 DIAGNOSIS — C3490 Malignant neoplasm of unspecified part of unspecified bronchus or lung: Secondary | ICD-10-CM | POA: Diagnosis not present

## 2017-12-29 DIAGNOSIS — L89612 Pressure ulcer of right heel, stage 2: Secondary | ICD-10-CM | POA: Diagnosis not present

## 2017-12-30 DIAGNOSIS — S72001D Fracture of unspecified part of neck of right femur, subsequent encounter for closed fracture with routine healing: Secondary | ICD-10-CM | POA: Diagnosis not present

## 2017-12-30 DIAGNOSIS — C3412 Malignant neoplasm of upper lobe, left bronchus or lung: Secondary | ICD-10-CM | POA: Diagnosis not present

## 2017-12-30 DIAGNOSIS — I739 Peripheral vascular disease, unspecified: Secondary | ICD-10-CM | POA: Diagnosis not present

## 2017-12-30 DIAGNOSIS — I10 Essential (primary) hypertension: Secondary | ICD-10-CM | POA: Diagnosis not present

## 2017-12-30 DIAGNOSIS — C3490 Malignant neoplasm of unspecified part of unspecified bronchus or lung: Secondary | ICD-10-CM | POA: Diagnosis not present

## 2017-12-30 DIAGNOSIS — J449 Chronic obstructive pulmonary disease, unspecified: Secondary | ICD-10-CM | POA: Diagnosis not present

## 2017-12-30 DIAGNOSIS — L89612 Pressure ulcer of right heel, stage 2: Secondary | ICD-10-CM | POA: Diagnosis not present

## 2017-12-30 DIAGNOSIS — Z510 Encounter for antineoplastic radiation therapy: Secondary | ICD-10-CM | POA: Diagnosis not present

## 2017-12-30 DIAGNOSIS — C3431 Malignant neoplasm of lower lobe, right bronchus or lung: Secondary | ICD-10-CM | POA: Diagnosis not present

## 2017-12-31 DIAGNOSIS — Z510 Encounter for antineoplastic radiation therapy: Secondary | ICD-10-CM | POA: Diagnosis not present

## 2017-12-31 DIAGNOSIS — I739 Peripheral vascular disease, unspecified: Secondary | ICD-10-CM | POA: Diagnosis not present

## 2017-12-31 DIAGNOSIS — C3432 Malignant neoplasm of lower lobe, left bronchus or lung: Secondary | ICD-10-CM | POA: Diagnosis not present

## 2017-12-31 DIAGNOSIS — S72001D Fracture of unspecified part of neck of right femur, subsequent encounter for closed fracture with routine healing: Secondary | ICD-10-CM | POA: Diagnosis not present

## 2017-12-31 DIAGNOSIS — J449 Chronic obstructive pulmonary disease, unspecified: Secondary | ICD-10-CM | POA: Diagnosis not present

## 2017-12-31 DIAGNOSIS — I10 Essential (primary) hypertension: Secondary | ICD-10-CM | POA: Diagnosis not present

## 2017-12-31 DIAGNOSIS — C3431 Malignant neoplasm of lower lobe, right bronchus or lung: Secondary | ICD-10-CM | POA: Diagnosis not present

## 2017-12-31 DIAGNOSIS — C3412 Malignant neoplasm of upper lobe, left bronchus or lung: Secondary | ICD-10-CM | POA: Diagnosis not present

## 2017-12-31 DIAGNOSIS — L89612 Pressure ulcer of right heel, stage 2: Secondary | ICD-10-CM | POA: Diagnosis not present

## 2017-12-31 DIAGNOSIS — C3490 Malignant neoplasm of unspecified part of unspecified bronchus or lung: Secondary | ICD-10-CM | POA: Diagnosis not present

## 2018-01-03 DIAGNOSIS — J449 Chronic obstructive pulmonary disease, unspecified: Secondary | ICD-10-CM | POA: Diagnosis not present

## 2018-01-03 DIAGNOSIS — C3490 Malignant neoplasm of unspecified part of unspecified bronchus or lung: Secondary | ICD-10-CM | POA: Diagnosis not present

## 2018-01-03 DIAGNOSIS — S72001D Fracture of unspecified part of neck of right femur, subsequent encounter for closed fracture with routine healing: Secondary | ICD-10-CM | POA: Diagnosis not present

## 2018-01-03 DIAGNOSIS — I739 Peripheral vascular disease, unspecified: Secondary | ICD-10-CM | POA: Diagnosis not present

## 2018-01-03 DIAGNOSIS — L89612 Pressure ulcer of right heel, stage 2: Secondary | ICD-10-CM | POA: Diagnosis not present

## 2018-01-03 DIAGNOSIS — C3431 Malignant neoplasm of lower lobe, right bronchus or lung: Secondary | ICD-10-CM | POA: Diagnosis not present

## 2018-01-03 DIAGNOSIS — I10 Essential (primary) hypertension: Secondary | ICD-10-CM | POA: Diagnosis not present

## 2018-01-03 DIAGNOSIS — Z510 Encounter for antineoplastic radiation therapy: Secondary | ICD-10-CM | POA: Diagnosis not present

## 2018-01-03 DIAGNOSIS — C3432 Malignant neoplasm of lower lobe, left bronchus or lung: Secondary | ICD-10-CM | POA: Diagnosis not present

## 2018-01-03 DIAGNOSIS — C3412 Malignant neoplasm of upper lobe, left bronchus or lung: Secondary | ICD-10-CM | POA: Diagnosis not present

## 2018-01-04 DIAGNOSIS — I10 Essential (primary) hypertension: Secondary | ICD-10-CM | POA: Diagnosis not present

## 2018-01-04 DIAGNOSIS — I739 Peripheral vascular disease, unspecified: Secondary | ICD-10-CM | POA: Diagnosis not present

## 2018-01-04 DIAGNOSIS — C3431 Malignant neoplasm of lower lobe, right bronchus or lung: Secondary | ICD-10-CM | POA: Diagnosis not present

## 2018-01-04 DIAGNOSIS — Z510 Encounter for antineoplastic radiation therapy: Secondary | ICD-10-CM | POA: Diagnosis not present

## 2018-01-04 DIAGNOSIS — S72001D Fracture of unspecified part of neck of right femur, subsequent encounter for closed fracture with routine healing: Secondary | ICD-10-CM | POA: Diagnosis not present

## 2018-01-04 DIAGNOSIS — C3412 Malignant neoplasm of upper lobe, left bronchus or lung: Secondary | ICD-10-CM | POA: Diagnosis not present

## 2018-01-04 DIAGNOSIS — J449 Chronic obstructive pulmonary disease, unspecified: Secondary | ICD-10-CM | POA: Diagnosis not present

## 2018-01-04 DIAGNOSIS — C3432 Malignant neoplasm of lower lobe, left bronchus or lung: Secondary | ICD-10-CM | POA: Diagnosis not present

## 2018-01-04 DIAGNOSIS — L89612 Pressure ulcer of right heel, stage 2: Secondary | ICD-10-CM | POA: Diagnosis not present

## 2018-01-04 DIAGNOSIS — C3490 Malignant neoplasm of unspecified part of unspecified bronchus or lung: Secondary | ICD-10-CM | POA: Diagnosis not present

## 2018-01-05 DIAGNOSIS — Z510 Encounter for antineoplastic radiation therapy: Secondary | ICD-10-CM | POA: Diagnosis not present

## 2018-01-05 DIAGNOSIS — C3431 Malignant neoplasm of lower lobe, right bronchus or lung: Secondary | ICD-10-CM | POA: Diagnosis not present

## 2018-01-05 DIAGNOSIS — C3490 Malignant neoplasm of unspecified part of unspecified bronchus or lung: Secondary | ICD-10-CM | POA: Diagnosis not present

## 2018-01-05 DIAGNOSIS — L89612 Pressure ulcer of right heel, stage 2: Secondary | ICD-10-CM | POA: Diagnosis not present

## 2018-01-05 DIAGNOSIS — S72001D Fracture of unspecified part of neck of right femur, subsequent encounter for closed fracture with routine healing: Secondary | ICD-10-CM | POA: Diagnosis not present

## 2018-01-05 DIAGNOSIS — I739 Peripheral vascular disease, unspecified: Secondary | ICD-10-CM | POA: Diagnosis not present

## 2018-01-05 DIAGNOSIS — C3432 Malignant neoplasm of lower lobe, left bronchus or lung: Secondary | ICD-10-CM | POA: Diagnosis not present

## 2018-01-05 DIAGNOSIS — J449 Chronic obstructive pulmonary disease, unspecified: Secondary | ICD-10-CM | POA: Diagnosis not present

## 2018-01-05 DIAGNOSIS — C3412 Malignant neoplasm of upper lobe, left bronchus or lung: Secondary | ICD-10-CM | POA: Diagnosis not present

## 2018-01-05 DIAGNOSIS — I10 Essential (primary) hypertension: Secondary | ICD-10-CM | POA: Diagnosis not present

## 2018-01-06 DIAGNOSIS — S72001D Fracture of unspecified part of neck of right femur, subsequent encounter for closed fracture with routine healing: Secondary | ICD-10-CM | POA: Diagnosis not present

## 2018-01-06 DIAGNOSIS — I10 Essential (primary) hypertension: Secondary | ICD-10-CM | POA: Diagnosis not present

## 2018-01-06 DIAGNOSIS — C3432 Malignant neoplasm of lower lobe, left bronchus or lung: Secondary | ICD-10-CM | POA: Diagnosis not present

## 2018-01-06 DIAGNOSIS — C3412 Malignant neoplasm of upper lobe, left bronchus or lung: Secondary | ICD-10-CM | POA: Diagnosis not present

## 2018-01-06 DIAGNOSIS — I739 Peripheral vascular disease, unspecified: Secondary | ICD-10-CM | POA: Diagnosis not present

## 2018-01-06 DIAGNOSIS — C3431 Malignant neoplasm of lower lobe, right bronchus or lung: Secondary | ICD-10-CM | POA: Diagnosis not present

## 2018-01-06 DIAGNOSIS — C3490 Malignant neoplasm of unspecified part of unspecified bronchus or lung: Secondary | ICD-10-CM | POA: Diagnosis not present

## 2018-01-06 DIAGNOSIS — Z510 Encounter for antineoplastic radiation therapy: Secondary | ICD-10-CM | POA: Diagnosis not present

## 2018-01-06 DIAGNOSIS — L89612 Pressure ulcer of right heel, stage 2: Secondary | ICD-10-CM | POA: Diagnosis not present

## 2018-01-06 DIAGNOSIS — J449 Chronic obstructive pulmonary disease, unspecified: Secondary | ICD-10-CM | POA: Diagnosis not present

## 2018-01-07 DIAGNOSIS — C3432 Malignant neoplasm of lower lobe, left bronchus or lung: Secondary | ICD-10-CM | POA: Diagnosis not present

## 2018-01-07 DIAGNOSIS — S72001D Fracture of unspecified part of neck of right femur, subsequent encounter for closed fracture with routine healing: Secondary | ICD-10-CM | POA: Diagnosis not present

## 2018-01-07 DIAGNOSIS — C3431 Malignant neoplasm of lower lobe, right bronchus or lung: Secondary | ICD-10-CM | POA: Diagnosis not present

## 2018-01-07 DIAGNOSIS — I10 Essential (primary) hypertension: Secondary | ICD-10-CM | POA: Diagnosis not present

## 2018-01-07 DIAGNOSIS — C3490 Malignant neoplasm of unspecified part of unspecified bronchus or lung: Secondary | ICD-10-CM | POA: Diagnosis not present

## 2018-01-07 DIAGNOSIS — L89612 Pressure ulcer of right heel, stage 2: Secondary | ICD-10-CM | POA: Diagnosis not present

## 2018-01-07 DIAGNOSIS — J449 Chronic obstructive pulmonary disease, unspecified: Secondary | ICD-10-CM | POA: Diagnosis not present

## 2018-01-07 DIAGNOSIS — C3412 Malignant neoplasm of upper lobe, left bronchus or lung: Secondary | ICD-10-CM | POA: Diagnosis not present

## 2018-01-07 DIAGNOSIS — Z510 Encounter for antineoplastic radiation therapy: Secondary | ICD-10-CM | POA: Diagnosis not present

## 2018-01-07 DIAGNOSIS — I739 Peripheral vascular disease, unspecified: Secondary | ICD-10-CM | POA: Diagnosis not present

## 2018-01-10 DIAGNOSIS — S72001D Fracture of unspecified part of neck of right femur, subsequent encounter for closed fracture with routine healing: Secondary | ICD-10-CM | POA: Diagnosis not present

## 2018-01-10 DIAGNOSIS — C3432 Malignant neoplasm of lower lobe, left bronchus or lung: Secondary | ICD-10-CM | POA: Diagnosis not present

## 2018-01-10 DIAGNOSIS — C3490 Malignant neoplasm of unspecified part of unspecified bronchus or lung: Secondary | ICD-10-CM | POA: Diagnosis not present

## 2018-01-10 DIAGNOSIS — Z510 Encounter for antineoplastic radiation therapy: Secondary | ICD-10-CM | POA: Diagnosis not present

## 2018-01-10 DIAGNOSIS — C3412 Malignant neoplasm of upper lobe, left bronchus or lung: Secondary | ICD-10-CM | POA: Diagnosis not present

## 2018-01-10 DIAGNOSIS — C3431 Malignant neoplasm of lower lobe, right bronchus or lung: Secondary | ICD-10-CM | POA: Diagnosis not present

## 2018-01-10 DIAGNOSIS — I739 Peripheral vascular disease, unspecified: Secondary | ICD-10-CM | POA: Diagnosis not present

## 2018-01-10 DIAGNOSIS — I10 Essential (primary) hypertension: Secondary | ICD-10-CM | POA: Diagnosis not present

## 2018-01-10 DIAGNOSIS — J449 Chronic obstructive pulmonary disease, unspecified: Secondary | ICD-10-CM | POA: Diagnosis not present

## 2018-01-10 DIAGNOSIS — L89612 Pressure ulcer of right heel, stage 2: Secondary | ICD-10-CM | POA: Diagnosis not present

## 2018-01-11 DIAGNOSIS — C3431 Malignant neoplasm of lower lobe, right bronchus or lung: Secondary | ICD-10-CM | POA: Diagnosis not present

## 2018-01-11 DIAGNOSIS — C3432 Malignant neoplasm of lower lobe, left bronchus or lung: Secondary | ICD-10-CM | POA: Diagnosis not present

## 2018-01-11 DIAGNOSIS — Z510 Encounter for antineoplastic radiation therapy: Secondary | ICD-10-CM | POA: Diagnosis not present

## 2018-01-11 DIAGNOSIS — C3412 Malignant neoplasm of upper lobe, left bronchus or lung: Secondary | ICD-10-CM | POA: Diagnosis not present

## 2018-01-12 DIAGNOSIS — C3431 Malignant neoplasm of lower lobe, right bronchus or lung: Secondary | ICD-10-CM | POA: Diagnosis not present

## 2018-01-12 DIAGNOSIS — C3412 Malignant neoplasm of upper lobe, left bronchus or lung: Secondary | ICD-10-CM | POA: Diagnosis not present

## 2018-01-12 DIAGNOSIS — Z510 Encounter for antineoplastic radiation therapy: Secondary | ICD-10-CM | POA: Diagnosis not present

## 2018-01-12 DIAGNOSIS — C3432 Malignant neoplasm of lower lobe, left bronchus or lung: Secondary | ICD-10-CM | POA: Diagnosis not present

## 2018-01-13 DIAGNOSIS — C3490 Malignant neoplasm of unspecified part of unspecified bronchus or lung: Secondary | ICD-10-CM | POA: Diagnosis not present

## 2018-01-13 DIAGNOSIS — Z510 Encounter for antineoplastic radiation therapy: Secondary | ICD-10-CM | POA: Diagnosis not present

## 2018-01-13 DIAGNOSIS — C3432 Malignant neoplasm of lower lobe, left bronchus or lung: Secondary | ICD-10-CM | POA: Diagnosis not present

## 2018-01-13 DIAGNOSIS — L89612 Pressure ulcer of right heel, stage 2: Secondary | ICD-10-CM | POA: Diagnosis not present

## 2018-01-13 DIAGNOSIS — J449 Chronic obstructive pulmonary disease, unspecified: Secondary | ICD-10-CM | POA: Diagnosis not present

## 2018-01-13 DIAGNOSIS — S72001D Fracture of unspecified part of neck of right femur, subsequent encounter for closed fracture with routine healing: Secondary | ICD-10-CM | POA: Diagnosis not present

## 2018-01-13 DIAGNOSIS — I10 Essential (primary) hypertension: Secondary | ICD-10-CM | POA: Diagnosis not present

## 2018-01-13 DIAGNOSIS — C3431 Malignant neoplasm of lower lobe, right bronchus or lung: Secondary | ICD-10-CM | POA: Diagnosis not present

## 2018-01-13 DIAGNOSIS — C3412 Malignant neoplasm of upper lobe, left bronchus or lung: Secondary | ICD-10-CM | POA: Diagnosis not present

## 2018-01-13 DIAGNOSIS — I739 Peripheral vascular disease, unspecified: Secondary | ICD-10-CM | POA: Diagnosis not present

## 2018-01-14 DIAGNOSIS — Z510 Encounter for antineoplastic radiation therapy: Secondary | ICD-10-CM | POA: Diagnosis not present

## 2018-01-14 DIAGNOSIS — C3432 Malignant neoplasm of lower lobe, left bronchus or lung: Secondary | ICD-10-CM | POA: Diagnosis not present

## 2018-01-14 DIAGNOSIS — C3431 Malignant neoplasm of lower lobe, right bronchus or lung: Secondary | ICD-10-CM | POA: Diagnosis not present

## 2018-01-17 DIAGNOSIS — C3432 Malignant neoplasm of lower lobe, left bronchus or lung: Secondary | ICD-10-CM | POA: Diagnosis not present

## 2018-01-17 DIAGNOSIS — Z510 Encounter for antineoplastic radiation therapy: Secondary | ICD-10-CM | POA: Diagnosis not present

## 2018-01-17 DIAGNOSIS — C3412 Malignant neoplasm of upper lobe, left bronchus or lung: Secondary | ICD-10-CM | POA: Diagnosis not present

## 2018-01-17 DIAGNOSIS — C3431 Malignant neoplasm of lower lobe, right bronchus or lung: Secondary | ICD-10-CM | POA: Diagnosis not present

## 2018-01-18 DIAGNOSIS — C3432 Malignant neoplasm of lower lobe, left bronchus or lung: Secondary | ICD-10-CM | POA: Diagnosis not present

## 2018-01-18 DIAGNOSIS — I739 Peripheral vascular disease, unspecified: Secondary | ICD-10-CM | POA: Diagnosis not present

## 2018-01-18 DIAGNOSIS — I10 Essential (primary) hypertension: Secondary | ICD-10-CM | POA: Diagnosis not present

## 2018-01-18 DIAGNOSIS — C3412 Malignant neoplasm of upper lobe, left bronchus or lung: Secondary | ICD-10-CM | POA: Diagnosis not present

## 2018-01-18 DIAGNOSIS — J449 Chronic obstructive pulmonary disease, unspecified: Secondary | ICD-10-CM | POA: Diagnosis not present

## 2018-01-18 DIAGNOSIS — C3431 Malignant neoplasm of lower lobe, right bronchus or lung: Secondary | ICD-10-CM | POA: Diagnosis not present

## 2018-01-18 DIAGNOSIS — L89612 Pressure ulcer of right heel, stage 2: Secondary | ICD-10-CM | POA: Diagnosis not present

## 2018-01-18 DIAGNOSIS — C3490 Malignant neoplasm of unspecified part of unspecified bronchus or lung: Secondary | ICD-10-CM | POA: Diagnosis not present

## 2018-01-18 DIAGNOSIS — S72001D Fracture of unspecified part of neck of right femur, subsequent encounter for closed fracture with routine healing: Secondary | ICD-10-CM | POA: Diagnosis not present

## 2018-01-18 DIAGNOSIS — Z510 Encounter for antineoplastic radiation therapy: Secondary | ICD-10-CM | POA: Diagnosis not present

## 2018-01-19 DIAGNOSIS — C3490 Malignant neoplasm of unspecified part of unspecified bronchus or lung: Secondary | ICD-10-CM | POA: Diagnosis not present

## 2018-01-19 DIAGNOSIS — S72001D Fracture of unspecified part of neck of right femur, subsequent encounter for closed fracture with routine healing: Secondary | ICD-10-CM | POA: Diagnosis not present

## 2018-01-19 DIAGNOSIS — J449 Chronic obstructive pulmonary disease, unspecified: Secondary | ICD-10-CM | POA: Diagnosis not present

## 2018-01-19 DIAGNOSIS — I739 Peripheral vascular disease, unspecified: Secondary | ICD-10-CM | POA: Diagnosis not present

## 2018-01-19 DIAGNOSIS — I10 Essential (primary) hypertension: Secondary | ICD-10-CM | POA: Diagnosis not present

## 2018-01-19 DIAGNOSIS — L89612 Pressure ulcer of right heel, stage 2: Secondary | ICD-10-CM | POA: Diagnosis not present

## 2018-02-02 DIAGNOSIS — R0689 Other abnormalities of breathing: Secondary | ICD-10-CM | POA: Diagnosis not present

## 2018-02-02 DIAGNOSIS — S1083XA Contusion of other specified part of neck, initial encounter: Secondary | ICD-10-CM | POA: Diagnosis not present

## 2018-02-02 DIAGNOSIS — F419 Anxiety disorder, unspecified: Secondary | ICD-10-CM | POA: Diagnosis present

## 2018-02-02 DIAGNOSIS — Z9981 Dependence on supplemental oxygen: Secondary | ICD-10-CM | POA: Diagnosis not present

## 2018-02-02 DIAGNOSIS — Z85819 Personal history of malignant neoplasm of unspecified site of lip, oral cavity, and pharynx: Secondary | ICD-10-CM | POA: Diagnosis not present

## 2018-02-02 DIAGNOSIS — I10 Essential (primary) hypertension: Secondary | ICD-10-CM | POA: Diagnosis present

## 2018-02-02 DIAGNOSIS — S1093XA Contusion of unspecified part of neck, initial encounter: Secondary | ICD-10-CM | POA: Diagnosis not present

## 2018-02-02 DIAGNOSIS — C3491 Malignant neoplasm of unspecified part of right bronchus or lung: Secondary | ICD-10-CM | POA: Diagnosis present

## 2018-02-02 DIAGNOSIS — S1093XD Contusion of unspecified part of neck, subsequent encounter: Secondary | ICD-10-CM | POA: Diagnosis not present

## 2018-02-02 DIAGNOSIS — S299XXA Unspecified injury of thorax, initial encounter: Secondary | ICD-10-CM | POA: Diagnosis not present

## 2018-02-02 DIAGNOSIS — S0093XA Contusion of unspecified part of head, initial encounter: Secondary | ICD-10-CM | POA: Diagnosis not present

## 2018-02-02 DIAGNOSIS — D62 Acute posthemorrhagic anemia: Secondary | ICD-10-CM | POA: Diagnosis not present

## 2018-02-02 DIAGNOSIS — R9431 Abnormal electrocardiogram [ECG] [EKG]: Secondary | ICD-10-CM | POA: Diagnosis not present

## 2018-02-02 DIAGNOSIS — I745 Embolism and thrombosis of iliac artery: Secondary | ICD-10-CM | POA: Diagnosis present

## 2018-02-02 DIAGNOSIS — G4700 Insomnia, unspecified: Secondary | ICD-10-CM | POA: Diagnosis present

## 2018-02-02 DIAGNOSIS — K8689 Other specified diseases of pancreas: Secondary | ICD-10-CM | POA: Diagnosis not present

## 2018-02-02 DIAGNOSIS — E7800 Pure hypercholesterolemia, unspecified: Secondary | ICD-10-CM | POA: Diagnosis not present

## 2018-02-02 DIAGNOSIS — S161XXA Strain of muscle, fascia and tendon at neck level, initial encounter: Secondary | ICD-10-CM | POA: Diagnosis not present

## 2018-02-02 DIAGNOSIS — S3991XA Unspecified injury of abdomen, initial encounter: Secondary | ICD-10-CM | POA: Diagnosis not present

## 2018-02-02 DIAGNOSIS — R0602 Shortness of breath: Secondary | ICD-10-CM | POA: Diagnosis not present

## 2018-02-02 DIAGNOSIS — S5012XA Contusion of left forearm, initial encounter: Secondary | ICD-10-CM | POA: Diagnosis not present

## 2018-02-02 DIAGNOSIS — G319 Degenerative disease of nervous system, unspecified: Secondary | ICD-10-CM | POA: Diagnosis not present

## 2018-02-02 DIAGNOSIS — Y92410 Unspecified street and highway as the place of occurrence of the external cause: Secondary | ICD-10-CM | POA: Diagnosis not present

## 2018-02-02 DIAGNOSIS — R918 Other nonspecific abnormal finding of lung field: Secondary | ICD-10-CM | POA: Diagnosis not present

## 2018-02-02 DIAGNOSIS — I6521 Occlusion and stenosis of right carotid artery: Secondary | ICD-10-CM | POA: Diagnosis present

## 2018-02-02 DIAGNOSIS — S2232XA Fracture of one rib, left side, initial encounter for closed fracture: Secondary | ICD-10-CM | POA: Diagnosis present

## 2018-02-02 DIAGNOSIS — Z9911 Dependence on respirator [ventilator] status: Secondary | ICD-10-CM | POA: Diagnosis not present

## 2018-02-02 DIAGNOSIS — E785 Hyperlipidemia, unspecified: Secondary | ICD-10-CM | POA: Diagnosis present

## 2018-02-02 DIAGNOSIS — S2232XD Fracture of one rib, left side, subsequent encounter for fracture with routine healing: Secondary | ICD-10-CM | POA: Diagnosis not present

## 2018-02-02 DIAGNOSIS — C3490 Malignant neoplasm of unspecified part of unspecified bronchus or lung: Secondary | ICD-10-CM | POA: Diagnosis not present

## 2018-02-02 DIAGNOSIS — I493 Ventricular premature depolarization: Secondary | ICD-10-CM | POA: Diagnosis not present

## 2018-02-02 DIAGNOSIS — R58 Hemorrhage, not elsewhere classified: Secondary | ICD-10-CM | POA: Diagnosis not present

## 2018-02-02 DIAGNOSIS — E1151 Type 2 diabetes mellitus with diabetic peripheral angiopathy without gangrene: Secondary | ICD-10-CM | POA: Diagnosis present

## 2018-02-02 DIAGNOSIS — J449 Chronic obstructive pulmonary disease, unspecified: Secondary | ICD-10-CM | POA: Diagnosis present

## 2018-02-02 DIAGNOSIS — J439 Emphysema, unspecified: Secondary | ICD-10-CM | POA: Diagnosis not present

## 2018-02-02 DIAGNOSIS — R000 Tachycardia, unspecified: Secondary | ICD-10-CM | POA: Diagnosis present

## 2018-02-02 DIAGNOSIS — S0003XA Contusion of scalp, initial encounter: Secondary | ICD-10-CM | POA: Diagnosis not present

## 2018-02-02 DIAGNOSIS — Z7982 Long term (current) use of aspirin: Secondary | ICD-10-CM | POA: Diagnosis not present

## 2018-02-02 DIAGNOSIS — I714 Abdominal aortic aneurysm, without rupture: Secondary | ICD-10-CM | POA: Diagnosis not present

## 2018-02-02 DIAGNOSIS — G540 Brachial plexus disorders: Secondary | ICD-10-CM | POA: Diagnosis not present

## 2018-02-02 DIAGNOSIS — R40241 Glasgow coma scale score 13-15, unspecified time: Secondary | ICD-10-CM | POA: Diagnosis present

## 2018-02-02 DIAGNOSIS — K449 Diaphragmatic hernia without obstruction or gangrene: Secondary | ICD-10-CM | POA: Diagnosis present

## 2018-02-02 DIAGNOSIS — Z7901 Long term (current) use of anticoagulants: Secondary | ICD-10-CM | POA: Diagnosis not present

## 2018-02-02 DIAGNOSIS — Z79899 Other long term (current) drug therapy: Secondary | ICD-10-CM | POA: Diagnosis not present

## 2018-02-02 DIAGNOSIS — G8911 Acute pain due to trauma: Secondary | ICD-10-CM | POA: Diagnosis not present

## 2018-02-02 DIAGNOSIS — I6782 Cerebral ischemia: Secondary | ICD-10-CM | POA: Diagnosis not present

## 2018-02-02 DIAGNOSIS — S199XXA Unspecified injury of neck, initial encounter: Secondary | ICD-10-CM | POA: Diagnosis not present

## 2018-02-02 DIAGNOSIS — Z87891 Personal history of nicotine dependence: Secondary | ICD-10-CM | POA: Diagnosis not present

## 2018-02-02 DIAGNOSIS — R52 Pain, unspecified: Secondary | ICD-10-CM | POA: Diagnosis not present

## 2018-02-02 DIAGNOSIS — I719 Aortic aneurysm of unspecified site, without rupture: Secondary | ICD-10-CM | POA: Diagnosis not present

## 2018-02-02 DIAGNOSIS — I2510 Atherosclerotic heart disease of native coronary artery without angina pectoris: Secondary | ICD-10-CM | POA: Diagnosis present

## 2018-02-02 DIAGNOSIS — Z952 Presence of prosthetic heart valve: Secondary | ICD-10-CM | POA: Diagnosis not present

## 2018-02-02 DIAGNOSIS — S51812A Laceration without foreign body of left forearm, initial encounter: Secondary | ICD-10-CM | POA: Diagnosis not present

## 2018-02-02 DIAGNOSIS — R0789 Other chest pain: Secondary | ICD-10-CM | POA: Diagnosis not present

## 2018-02-02 DIAGNOSIS — Y998 Other external cause status: Secondary | ICD-10-CM | POA: Diagnosis not present

## 2018-02-11 DIAGNOSIS — R571 Hypovolemic shock: Secondary | ICD-10-CM | POA: Diagnosis not present

## 2018-02-11 DIAGNOSIS — Z6824 Body mass index (BMI) 24.0-24.9, adult: Secondary | ICD-10-CM | POA: Diagnosis not present

## 2018-02-11 DIAGNOSIS — I712 Thoracic aortic aneurysm, without rupture: Secondary | ICD-10-CM | POA: Diagnosis present

## 2018-02-11 DIAGNOSIS — I959 Hypotension, unspecified: Secondary | ICD-10-CM | POA: Diagnosis present

## 2018-02-11 DIAGNOSIS — J441 Chronic obstructive pulmonary disease with (acute) exacerbation: Secondary | ICD-10-CM | POA: Diagnosis present

## 2018-02-11 DIAGNOSIS — I480 Paroxysmal atrial fibrillation: Secondary | ICD-10-CM | POA: Diagnosis present

## 2018-02-11 DIAGNOSIS — F419 Anxiety disorder, unspecified: Secondary | ICD-10-CM | POA: Diagnosis present

## 2018-02-11 DIAGNOSIS — S2232XA Fracture of one rib, left side, initial encounter for closed fracture: Secondary | ICD-10-CM | POA: Diagnosis not present

## 2018-02-11 DIAGNOSIS — R000 Tachycardia, unspecified: Secondary | ICD-10-CM | POA: Diagnosis not present

## 2018-02-11 DIAGNOSIS — S2232XD Fracture of one rib, left side, subsequent encounter for fracture with routine healing: Secondary | ICD-10-CM | POA: Diagnosis not present

## 2018-02-11 DIAGNOSIS — Z87891 Personal history of nicotine dependence: Secondary | ICD-10-CM | POA: Diagnosis not present

## 2018-02-11 DIAGNOSIS — I2510 Atherosclerotic heart disease of native coronary artery without angina pectoris: Secondary | ICD-10-CM | POA: Diagnosis present

## 2018-02-11 DIAGNOSIS — J449 Chronic obstructive pulmonary disease, unspecified: Secondary | ICD-10-CM | POA: Diagnosis not present

## 2018-02-11 DIAGNOSIS — D500 Iron deficiency anemia secondary to blood loss (chronic): Secondary | ICD-10-CM | POA: Diagnosis not present

## 2018-02-11 DIAGNOSIS — Z4682 Encounter for fitting and adjustment of non-vascular catheter: Secondary | ICD-10-CM | POA: Diagnosis not present

## 2018-02-11 DIAGNOSIS — N390 Urinary tract infection, site not specified: Secondary | ICD-10-CM | POA: Diagnosis present

## 2018-02-11 DIAGNOSIS — E7800 Pure hypercholesterolemia, unspecified: Secondary | ICD-10-CM | POA: Diagnosis not present

## 2018-02-11 DIAGNOSIS — I719 Aortic aneurysm of unspecified site, without rupture: Secondary | ICD-10-CM | POA: Diagnosis not present

## 2018-02-11 DIAGNOSIS — R0902 Hypoxemia: Secondary | ICD-10-CM | POA: Diagnosis not present

## 2018-02-11 DIAGNOSIS — R0600 Dyspnea, unspecified: Secondary | ICD-10-CM | POA: Diagnosis not present

## 2018-02-11 DIAGNOSIS — T424X5A Adverse effect of benzodiazepines, initial encounter: Secondary | ICD-10-CM | POA: Diagnosis present

## 2018-02-11 DIAGNOSIS — J9622 Acute and chronic respiratory failure with hypercapnia: Secondary | ICD-10-CM | POA: Diagnosis present

## 2018-02-11 DIAGNOSIS — J9601 Acute respiratory failure with hypoxia: Secondary | ICD-10-CM | POA: Diagnosis not present

## 2018-02-11 DIAGNOSIS — I340 Nonrheumatic mitral (valve) insufficiency: Secondary | ICD-10-CM | POA: Diagnosis not present

## 2018-02-11 DIAGNOSIS — S1093XA Contusion of unspecified part of neck, initial encounter: Secondary | ICD-10-CM | POA: Diagnosis not present

## 2018-02-11 DIAGNOSIS — Z79899 Other long term (current) drug therapy: Secondary | ICD-10-CM | POA: Diagnosis not present

## 2018-02-11 DIAGNOSIS — E43 Unspecified severe protein-calorie malnutrition: Secondary | ICD-10-CM | POA: Diagnosis present

## 2018-02-11 DIAGNOSIS — J439 Emphysema, unspecified: Secondary | ICD-10-CM | POA: Diagnosis not present

## 2018-02-11 DIAGNOSIS — K449 Diaphragmatic hernia without obstruction or gangrene: Secondary | ICD-10-CM | POA: Diagnosis not present

## 2018-02-11 DIAGNOSIS — N281 Cyst of kidney, acquired: Secondary | ICD-10-CM | POA: Diagnosis not present

## 2018-02-11 DIAGNOSIS — J9621 Acute and chronic respiratory failure with hypoxia: Secondary | ICD-10-CM | POA: Diagnosis not present

## 2018-02-11 DIAGNOSIS — R069 Unspecified abnormalities of breathing: Secondary | ICD-10-CM | POA: Diagnosis not present

## 2018-02-11 DIAGNOSIS — I509 Heart failure, unspecified: Secondary | ICD-10-CM | POA: Diagnosis not present

## 2018-02-11 DIAGNOSIS — Z9889 Other specified postprocedural states: Secondary | ICD-10-CM | POA: Diagnosis not present

## 2018-02-11 DIAGNOSIS — C3490 Malignant neoplasm of unspecified part of unspecified bronchus or lung: Secondary | ICD-10-CM | POA: Diagnosis present

## 2018-02-11 DIAGNOSIS — B961 Klebsiella pneumoniae [K. pneumoniae] as the cause of diseases classified elsewhere: Secondary | ICD-10-CM | POA: Diagnosis present

## 2018-02-11 DIAGNOSIS — I714 Abdominal aortic aneurysm, without rupture: Secondary | ICD-10-CM | POA: Diagnosis not present

## 2018-02-11 DIAGNOSIS — Z952 Presence of prosthetic heart valve: Secondary | ICD-10-CM | POA: Diagnosis not present

## 2018-02-11 DIAGNOSIS — Z7982 Long term (current) use of aspirin: Secondary | ICD-10-CM | POA: Diagnosis not present

## 2018-02-11 DIAGNOSIS — R9431 Abnormal electrocardiogram [ECG] [EKG]: Secondary | ICD-10-CM | POA: Diagnosis present

## 2018-02-11 DIAGNOSIS — E860 Dehydration: Secondary | ICD-10-CM | POA: Diagnosis present

## 2018-02-11 DIAGNOSIS — S1093XD Contusion of unspecified part of neck, subsequent encounter: Secondary | ICD-10-CM | POA: Diagnosis not present

## 2018-02-11 DIAGNOSIS — R0602 Shortness of breath: Secondary | ICD-10-CM | POA: Diagnosis not present

## 2018-02-11 DIAGNOSIS — I10 Essential (primary) hypertension: Secondary | ICD-10-CM | POA: Diagnosis not present

## 2018-02-12 DIAGNOSIS — R571 Hypovolemic shock: Secondary | ICD-10-CM | POA: Diagnosis not present

## 2018-02-12 DIAGNOSIS — R531 Weakness: Secondary | ICD-10-CM | POA: Diagnosis not present

## 2018-02-12 DIAGNOSIS — I480 Paroxysmal atrial fibrillation: Secondary | ICD-10-CM | POA: Diagnosis present

## 2018-02-12 DIAGNOSIS — J449 Chronic obstructive pulmonary disease, unspecified: Secondary | ICD-10-CM | POA: Diagnosis not present

## 2018-02-12 DIAGNOSIS — N281 Cyst of kidney, acquired: Secondary | ICD-10-CM | POA: Diagnosis not present

## 2018-02-12 DIAGNOSIS — J439 Emphysema, unspecified: Secondary | ICD-10-CM | POA: Diagnosis not present

## 2018-02-12 DIAGNOSIS — J9622 Acute and chronic respiratory failure with hypercapnia: Secondary | ICD-10-CM | POA: Diagnosis not present

## 2018-02-12 DIAGNOSIS — E43 Unspecified severe protein-calorie malnutrition: Secondary | ICD-10-CM | POA: Diagnosis not present

## 2018-02-12 DIAGNOSIS — Z79899 Other long term (current) drug therapy: Secondary | ICD-10-CM | POA: Diagnosis not present

## 2018-02-12 DIAGNOSIS — K449 Diaphragmatic hernia without obstruction or gangrene: Secondary | ICD-10-CM | POA: Diagnosis not present

## 2018-02-12 DIAGNOSIS — Z87891 Personal history of nicotine dependence: Secondary | ICD-10-CM | POA: Diagnosis not present

## 2018-02-12 DIAGNOSIS — I719 Aortic aneurysm of unspecified site, without rupture: Secondary | ICD-10-CM | POA: Diagnosis not present

## 2018-02-12 DIAGNOSIS — J441 Chronic obstructive pulmonary disease with (acute) exacerbation: Secondary | ICD-10-CM | POA: Diagnosis not present

## 2018-02-12 DIAGNOSIS — I959 Hypotension, unspecified: Secondary | ICD-10-CM | POA: Diagnosis present

## 2018-02-12 DIAGNOSIS — R069 Unspecified abnormalities of breathing: Secondary | ICD-10-CM | POA: Diagnosis not present

## 2018-02-12 DIAGNOSIS — C3490 Malignant neoplasm of unspecified part of unspecified bronchus or lung: Secondary | ICD-10-CM | POA: Diagnosis present

## 2018-02-12 DIAGNOSIS — I2510 Atherosclerotic heart disease of native coronary artery without angina pectoris: Secondary | ICD-10-CM | POA: Diagnosis present

## 2018-02-12 DIAGNOSIS — F419 Anxiety disorder, unspecified: Secondary | ICD-10-CM | POA: Diagnosis present

## 2018-02-12 DIAGNOSIS — D500 Iron deficiency anemia secondary to blood loss (chronic): Secondary | ICD-10-CM | POA: Diagnosis present

## 2018-02-12 DIAGNOSIS — R0902 Hypoxemia: Secondary | ICD-10-CM | POA: Diagnosis not present

## 2018-02-12 DIAGNOSIS — I10 Essential (primary) hypertension: Secondary | ICD-10-CM | POA: Diagnosis present

## 2018-02-12 DIAGNOSIS — S1093XD Contusion of unspecified part of neck, subsequent encounter: Secondary | ICD-10-CM | POA: Diagnosis not present

## 2018-02-12 DIAGNOSIS — B961 Klebsiella pneumoniae [K. pneumoniae] as the cause of diseases classified elsewhere: Secondary | ICD-10-CM | POA: Diagnosis present

## 2018-02-12 DIAGNOSIS — Z4682 Encounter for fitting and adjustment of non-vascular catheter: Secondary | ICD-10-CM | POA: Diagnosis not present

## 2018-02-12 DIAGNOSIS — Z7401 Bed confinement status: Secondary | ICD-10-CM | POA: Diagnosis not present

## 2018-02-12 DIAGNOSIS — S2232XD Fracture of one rib, left side, subsequent encounter for fracture with routine healing: Secondary | ICD-10-CM | POA: Diagnosis not present

## 2018-02-12 DIAGNOSIS — J440 Chronic obstructive pulmonary disease with acute lower respiratory infection: Secondary | ICD-10-CM | POA: Diagnosis not present

## 2018-02-12 DIAGNOSIS — N390 Urinary tract infection, site not specified: Secondary | ICD-10-CM | POA: Diagnosis present

## 2018-02-12 DIAGNOSIS — I509 Heart failure, unspecified: Secondary | ICD-10-CM | POA: Diagnosis not present

## 2018-02-12 DIAGNOSIS — R300 Dysuria: Secondary | ICD-10-CM | POA: Diagnosis not present

## 2018-02-12 DIAGNOSIS — Z9889 Other specified postprocedural states: Secondary | ICD-10-CM | POA: Diagnosis not present

## 2018-02-12 DIAGNOSIS — R000 Tachycardia, unspecified: Secondary | ICD-10-CM | POA: Diagnosis not present

## 2018-02-12 DIAGNOSIS — I714 Abdominal aortic aneurysm, without rupture: Secondary | ICD-10-CM | POA: Diagnosis not present

## 2018-02-12 DIAGNOSIS — I712 Thoracic aortic aneurysm, without rupture: Secondary | ICD-10-CM | POA: Diagnosis present

## 2018-02-12 DIAGNOSIS — R0600 Dyspnea, unspecified: Secondary | ICD-10-CM | POA: Diagnosis not present

## 2018-02-12 DIAGNOSIS — Z7982 Long term (current) use of aspirin: Secondary | ICD-10-CM | POA: Diagnosis not present

## 2018-02-12 DIAGNOSIS — J9621 Acute and chronic respiratory failure with hypoxia: Secondary | ICD-10-CM | POA: Diagnosis not present

## 2018-02-12 DIAGNOSIS — M2550 Pain in unspecified joint: Secondary | ICD-10-CM | POA: Diagnosis not present

## 2018-02-12 DIAGNOSIS — I340 Nonrheumatic mitral (valve) insufficiency: Secondary | ICD-10-CM | POA: Diagnosis not present

## 2018-02-12 DIAGNOSIS — R9431 Abnormal electrocardiogram [ECG] [EKG]: Secondary | ICD-10-CM | POA: Diagnosis present

## 2018-02-12 DIAGNOSIS — J9601 Acute respiratory failure with hypoxia: Secondary | ICD-10-CM | POA: Diagnosis not present

## 2018-02-12 DIAGNOSIS — E860 Dehydration: Secondary | ICD-10-CM | POA: Diagnosis not present

## 2018-02-12 DIAGNOSIS — R0602 Shortness of breath: Secondary | ICD-10-CM | POA: Diagnosis not present

## 2018-02-12 DIAGNOSIS — T424X5A Adverse effect of benzodiazepines, initial encounter: Secondary | ICD-10-CM | POA: Diagnosis present

## 2018-02-12 DIAGNOSIS — Z6824 Body mass index (BMI) 24.0-24.9, adult: Secondary | ICD-10-CM | POA: Diagnosis not present

## 2018-02-13 DIAGNOSIS — I340 Nonrheumatic mitral (valve) insufficiency: Secondary | ICD-10-CM

## 2018-02-16 DIAGNOSIS — Z79899 Other long term (current) drug therapy: Secondary | ICD-10-CM | POA: Diagnosis not present

## 2018-02-16 DIAGNOSIS — B961 Klebsiella pneumoniae [K. pneumoniae] as the cause of diseases classified elsewhere: Secondary | ICD-10-CM | POA: Diagnosis not present

## 2018-02-16 DIAGNOSIS — Z7401 Bed confinement status: Secondary | ICD-10-CM | POA: Diagnosis not present

## 2018-02-16 DIAGNOSIS — C3490 Malignant neoplasm of unspecified part of unspecified bronchus or lung: Secondary | ICD-10-CM | POA: Diagnosis not present

## 2018-02-16 DIAGNOSIS — J189 Pneumonia, unspecified organism: Secondary | ICD-10-CM | POA: Diagnosis not present

## 2018-02-16 DIAGNOSIS — R069 Unspecified abnormalities of breathing: Secondary | ICD-10-CM | POA: Diagnosis not present

## 2018-02-16 DIAGNOSIS — I493 Ventricular premature depolarization: Secondary | ICD-10-CM | POA: Diagnosis not present

## 2018-02-16 DIAGNOSIS — I712 Thoracic aortic aneurysm, without rupture: Secondary | ICD-10-CM | POA: Diagnosis not present

## 2018-02-16 DIAGNOSIS — I719 Aortic aneurysm of unspecified site, without rupture: Secondary | ICD-10-CM | POA: Diagnosis not present

## 2018-02-16 DIAGNOSIS — R571 Hypovolemic shock: Secondary | ICD-10-CM | POA: Diagnosis not present

## 2018-02-16 DIAGNOSIS — J440 Chronic obstructive pulmonary disease with acute lower respiratory infection: Secondary | ICD-10-CM | POA: Diagnosis not present

## 2018-02-16 DIAGNOSIS — J9621 Acute and chronic respiratory failure with hypoxia: Secondary | ICD-10-CM | POA: Diagnosis not present

## 2018-02-16 DIAGNOSIS — E785 Hyperlipidemia, unspecified: Secondary | ICD-10-CM | POA: Diagnosis not present

## 2018-02-16 DIAGNOSIS — J9601 Acute respiratory failure with hypoxia: Secondary | ICD-10-CM | POA: Diagnosis not present

## 2018-02-16 DIAGNOSIS — R9431 Abnormal electrocardiogram [ECG] [EKG]: Secondary | ICD-10-CM | POA: Diagnosis not present

## 2018-02-16 DIAGNOSIS — Z9889 Other specified postprocedural states: Secondary | ICD-10-CM | POA: Diagnosis not present

## 2018-02-16 DIAGNOSIS — E43 Unspecified severe protein-calorie malnutrition: Secondary | ICD-10-CM | POA: Diagnosis not present

## 2018-02-16 DIAGNOSIS — R300 Dysuria: Secondary | ICD-10-CM | POA: Diagnosis not present

## 2018-02-16 DIAGNOSIS — J80 Acute respiratory distress syndrome: Secondary | ICD-10-CM | POA: Diagnosis not present

## 2018-02-16 DIAGNOSIS — I480 Paroxysmal atrial fibrillation: Secondary | ICD-10-CM | POA: Diagnosis not present

## 2018-02-16 DIAGNOSIS — J9602 Acute respiratory failure with hypercapnia: Secondary | ICD-10-CM | POA: Diagnosis not present

## 2018-02-16 DIAGNOSIS — R0689 Other abnormalities of breathing: Secondary | ICD-10-CM | POA: Diagnosis not present

## 2018-02-16 DIAGNOSIS — M2550 Pain in unspecified joint: Secondary | ICD-10-CM | POA: Diagnosis not present

## 2018-02-16 DIAGNOSIS — J441 Chronic obstructive pulmonary disease with (acute) exacerbation: Secondary | ICD-10-CM | POA: Diagnosis not present

## 2018-02-16 DIAGNOSIS — R531 Weakness: Secondary | ICD-10-CM | POA: Diagnosis not present

## 2018-02-16 DIAGNOSIS — I491 Atrial premature depolarization: Secondary | ICD-10-CM | POA: Diagnosis not present

## 2018-02-16 DIAGNOSIS — I272 Pulmonary hypertension, unspecified: Secondary | ICD-10-CM | POA: Diagnosis not present

## 2018-02-16 DIAGNOSIS — C3412 Malignant neoplasm of upper lobe, left bronchus or lung: Secondary | ICD-10-CM | POA: Diagnosis not present

## 2018-02-16 DIAGNOSIS — F419 Anxiety disorder, unspecified: Secondary | ICD-10-CM | POA: Diagnosis not present

## 2018-02-16 DIAGNOSIS — R000 Tachycardia, unspecified: Secondary | ICD-10-CM | POA: Diagnosis not present

## 2018-02-16 DIAGNOSIS — I2510 Atherosclerotic heart disease of native coronary artery without angina pectoris: Secondary | ICD-10-CM | POA: Diagnosis not present

## 2018-02-16 DIAGNOSIS — R5381 Other malaise: Secondary | ICD-10-CM | POA: Diagnosis not present

## 2018-02-16 DIAGNOSIS — N390 Urinary tract infection, site not specified: Secondary | ICD-10-CM | POA: Diagnosis not present

## 2018-02-16 DIAGNOSIS — R918 Other nonspecific abnormal finding of lung field: Secondary | ICD-10-CM | POA: Diagnosis not present

## 2018-02-16 DIAGNOSIS — I10 Essential (primary) hypertension: Secondary | ICD-10-CM | POA: Diagnosis not present

## 2018-02-16 DIAGNOSIS — Z85118 Personal history of other malignant neoplasm of bronchus and lung: Secondary | ICD-10-CM | POA: Diagnosis not present

## 2018-02-20 DIAGNOSIS — R918 Other nonspecific abnormal finding of lung field: Secondary | ICD-10-CM | POA: Diagnosis not present

## 2018-02-20 DIAGNOSIS — J441 Chronic obstructive pulmonary disease with (acute) exacerbation: Secondary | ICD-10-CM | POA: Diagnosis not present

## 2018-02-20 DIAGNOSIS — C3490 Malignant neoplasm of unspecified part of unspecified bronchus or lung: Secondary | ICD-10-CM

## 2018-02-20 DIAGNOSIS — I712 Thoracic aortic aneurysm, without rupture: Secondary | ICD-10-CM

## 2018-02-20 DIAGNOSIS — Z9889 Other specified postprocedural states: Secondary | ICD-10-CM

## 2018-02-20 DIAGNOSIS — J9621 Acute and chronic respiratory failure with hypoxia: Secondary | ICD-10-CM

## 2018-02-21 DIAGNOSIS — I712 Thoracic aortic aneurysm, without rupture: Secondary | ICD-10-CM | POA: Diagnosis not present

## 2018-02-21 DIAGNOSIS — I2510 Atherosclerotic heart disease of native coronary artery without angina pectoris: Secondary | ICD-10-CM | POA: Diagnosis not present

## 2018-02-21 DIAGNOSIS — J440 Chronic obstructive pulmonary disease with acute lower respiratory infection: Secondary | ICD-10-CM | POA: Diagnosis present

## 2018-02-21 DIAGNOSIS — J441 Chronic obstructive pulmonary disease with (acute) exacerbation: Secondary | ICD-10-CM | POA: Diagnosis present

## 2018-02-21 DIAGNOSIS — Z7982 Long term (current) use of aspirin: Secondary | ICD-10-CM | POA: Diagnosis not present

## 2018-02-21 DIAGNOSIS — J189 Pneumonia, unspecified organism: Secondary | ICD-10-CM | POA: Diagnosis not present

## 2018-02-21 DIAGNOSIS — I739 Peripheral vascular disease, unspecified: Secondary | ICD-10-CM | POA: Diagnosis present

## 2018-02-21 DIAGNOSIS — J9621 Acute and chronic respiratory failure with hypoxia: Secondary | ICD-10-CM | POA: Diagnosis present

## 2018-02-21 DIAGNOSIS — S2232XD Fracture of one rib, left side, subsequent encounter for fracture with routine healing: Secondary | ICD-10-CM | POA: Diagnosis not present

## 2018-02-21 DIAGNOSIS — R000 Tachycardia, unspecified: Secondary | ICD-10-CM | POA: Diagnosis not present

## 2018-02-21 DIAGNOSIS — J9602 Acute respiratory failure with hypercapnia: Secondary | ICD-10-CM | POA: Diagnosis not present

## 2018-02-21 DIAGNOSIS — D6489 Other specified anemias: Secondary | ICD-10-CM | POA: Diagnosis present

## 2018-02-21 DIAGNOSIS — Z79899 Other long term (current) drug therapy: Secondary | ICD-10-CM | POA: Diagnosis not present

## 2018-02-21 DIAGNOSIS — I272 Pulmonary hypertension, unspecified: Secondary | ICD-10-CM | POA: Diagnosis present

## 2018-02-21 DIAGNOSIS — Z8701 Personal history of pneumonia (recurrent): Secondary | ICD-10-CM | POA: Diagnosis not present

## 2018-02-21 DIAGNOSIS — Z953 Presence of xenogenic heart valve: Secondary | ICD-10-CM | POA: Diagnosis not present

## 2018-02-21 DIAGNOSIS — J9601 Acute respiratory failure with hypoxia: Secondary | ICD-10-CM | POA: Diagnosis not present

## 2018-02-21 DIAGNOSIS — M1990 Unspecified osteoarthritis, unspecified site: Secondary | ICD-10-CM | POA: Diagnosis present

## 2018-02-21 DIAGNOSIS — C3490 Malignant neoplasm of unspecified part of unspecified bronchus or lung: Secondary | ICD-10-CM | POA: Diagnosis not present

## 2018-02-21 DIAGNOSIS — I491 Atrial premature depolarization: Secondary | ICD-10-CM | POA: Diagnosis not present

## 2018-02-21 DIAGNOSIS — I493 Ventricular premature depolarization: Secondary | ICD-10-CM | POA: Diagnosis not present

## 2018-02-21 DIAGNOSIS — R0602 Shortness of breath: Secondary | ICD-10-CM | POA: Diagnosis not present

## 2018-02-21 DIAGNOSIS — R918 Other nonspecific abnormal finding of lung field: Secondary | ICD-10-CM | POA: Diagnosis not present

## 2018-02-21 DIAGNOSIS — E440 Moderate protein-calorie malnutrition: Secondary | ICD-10-CM | POA: Diagnosis not present

## 2018-02-21 DIAGNOSIS — I10 Essential (primary) hypertension: Secondary | ICD-10-CM | POA: Diagnosis not present

## 2018-02-21 DIAGNOSIS — Z888 Allergy status to other drugs, medicaments and biological substances status: Secondary | ICD-10-CM | POA: Diagnosis not present

## 2018-02-21 DIAGNOSIS — Z87891 Personal history of nicotine dependence: Secondary | ICD-10-CM | POA: Diagnosis not present

## 2018-02-21 DIAGNOSIS — E785 Hyperlipidemia, unspecified: Secondary | ICD-10-CM | POA: Diagnosis present

## 2018-02-21 DIAGNOSIS — Z9889 Other specified postprocedural states: Secondary | ICD-10-CM | POA: Diagnosis not present

## 2018-02-21 DIAGNOSIS — C3412 Malignant neoplasm of upper lobe, left bronchus or lung: Secondary | ICD-10-CM | POA: Diagnosis not present

## 2018-02-21 DIAGNOSIS — E7800 Pure hypercholesterolemia, unspecified: Secondary | ICD-10-CM | POA: Diagnosis present

## 2018-02-21 DIAGNOSIS — Z85118 Personal history of other malignant neoplasm of bronchus and lung: Secondary | ICD-10-CM | POA: Diagnosis not present

## 2018-02-21 DIAGNOSIS — R5381 Other malaise: Secondary | ICD-10-CM | POA: Diagnosis not present

## 2018-02-23 DIAGNOSIS — E785 Hyperlipidemia, unspecified: Secondary | ICD-10-CM

## 2018-02-23 DIAGNOSIS — J441 Chronic obstructive pulmonary disease with (acute) exacerbation: Secondary | ICD-10-CM

## 2018-02-23 DIAGNOSIS — I10 Essential (primary) hypertension: Secondary | ICD-10-CM

## 2018-02-23 DIAGNOSIS — J9601 Acute respiratory failure with hypoxia: Secondary | ICD-10-CM

## 2018-02-23 DIAGNOSIS — R000 Tachycardia, unspecified: Secondary | ICD-10-CM

## 2018-02-24 DIAGNOSIS — C3412 Malignant neoplasm of upper lobe, left bronchus or lung: Secondary | ICD-10-CM

## 2018-02-24 DIAGNOSIS — Z85118 Personal history of other malignant neoplasm of bronchus and lung: Secondary | ICD-10-CM

## 2018-02-27 DIAGNOSIS — Z79899 Other long term (current) drug therapy: Secondary | ICD-10-CM

## 2018-02-27 DIAGNOSIS — I493 Ventricular premature depolarization: Secondary | ICD-10-CM

## 2018-03-02 DIAGNOSIS — J189 Pneumonia, unspecified organism: Secondary | ICD-10-CM

## 2018-03-03 ENCOUNTER — Inpatient Hospital Stay
Admit: 2018-03-03 | Discharge: 2018-03-22 | Disposition: A | Discharge status: Hospice | Payer: Medicare Other | Attending: Internal Medicine | Admitting: Internal Medicine

## 2018-03-03 DIAGNOSIS — I2510 Atherosclerotic heart disease of native coronary artery without angina pectoris: Secondary | ICD-10-CM | POA: Diagnosis not present

## 2018-03-03 DIAGNOSIS — I82621 Acute embolism and thrombosis of deep veins of right upper extremity: Secondary | ICD-10-CM | POA: Diagnosis not present

## 2018-03-03 DIAGNOSIS — K449 Diaphragmatic hernia without obstruction or gangrene: Secondary | ICD-10-CM | POA: Diagnosis not present

## 2018-03-03 DIAGNOSIS — Z923 Personal history of irradiation: Secondary | ICD-10-CM | POA: Diagnosis not present

## 2018-03-03 DIAGNOSIS — G9340 Encephalopathy, unspecified: Secondary | ICD-10-CM | POA: Diagnosis not present

## 2018-03-03 DIAGNOSIS — I272 Pulmonary hypertension, unspecified: Secondary | ICD-10-CM | POA: Diagnosis not present

## 2018-03-03 DIAGNOSIS — J9622 Acute and chronic respiratory failure with hypercapnia: Secondary | ICD-10-CM | POA: Diagnosis not present

## 2018-03-03 DIAGNOSIS — R1310 Dysphagia, unspecified: Secondary | ICD-10-CM | POA: Diagnosis not present

## 2018-03-03 DIAGNOSIS — Z87891 Personal history of nicotine dependence: Secondary | ICD-10-CM | POA: Diagnosis not present

## 2018-03-03 DIAGNOSIS — J189 Pneumonia, unspecified organism: Secondary | ICD-10-CM | POA: Diagnosis not present

## 2018-03-03 DIAGNOSIS — Z8521 Personal history of malignant neoplasm of larynx: Secondary | ICD-10-CM | POA: Diagnosis not present

## 2018-03-03 DIAGNOSIS — Z436 Encounter for attention to other artificial openings of urinary tract: Secondary | ICD-10-CM | POA: Diagnosis not present

## 2018-03-03 DIAGNOSIS — Z4659 Encounter for fitting and adjustment of other gastrointestinal appliance and device: Secondary | ICD-10-CM

## 2018-03-03 DIAGNOSIS — R0902 Hypoxemia: Secondary | ICD-10-CM | POA: Diagnosis not present

## 2018-03-03 DIAGNOSIS — J9621 Acute and chronic respiratory failure with hypoxia: Secondary | ICD-10-CM

## 2018-03-03 DIAGNOSIS — Z4682 Encounter for fitting and adjustment of non-vascular catheter: Secondary | ICD-10-CM | POA: Diagnosis not present

## 2018-03-03 DIAGNOSIS — M79602 Pain in left arm: Secondary | ICD-10-CM | POA: Diagnosis not present

## 2018-03-03 DIAGNOSIS — J9602 Acute respiratory failure with hypercapnia: Secondary | ICD-10-CM | POA: Diagnosis not present

## 2018-03-03 DIAGNOSIS — D696 Thrombocytopenia, unspecified: Secondary | ICD-10-CM | POA: Diagnosis not present

## 2018-03-03 DIAGNOSIS — I10 Essential (primary) hypertension: Secondary | ICD-10-CM | POA: Diagnosis not present

## 2018-03-03 DIAGNOSIS — Z95828 Presence of other vascular implants and grafts: Secondary | ICD-10-CM | POA: Diagnosis not present

## 2018-03-03 DIAGNOSIS — Z952 Presence of prosthetic heart valve: Secondary | ICD-10-CM | POA: Diagnosis not present

## 2018-03-03 DIAGNOSIS — J449 Chronic obstructive pulmonary disease, unspecified: Secondary | ICD-10-CM | POA: Diagnosis not present

## 2018-03-03 DIAGNOSIS — Z9981 Dependence on supplemental oxygen: Secondary | ICD-10-CM | POA: Diagnosis not present

## 2018-03-03 DIAGNOSIS — J181 Lobar pneumonia, unspecified organism: Secondary | ICD-10-CM

## 2018-03-03 DIAGNOSIS — I7101 Dissection of thoracic aorta: Secondary | ICD-10-CM | POA: Diagnosis not present

## 2018-03-03 DIAGNOSIS — J90 Pleural effusion, not elsewhere classified: Secondary | ICD-10-CM

## 2018-03-03 DIAGNOSIS — Z789 Other specified health status: Secondary | ICD-10-CM | POA: Diagnosis not present

## 2018-03-03 DIAGNOSIS — J188 Other pneumonia, unspecified organism: Secondary | ICD-10-CM | POA: Diagnosis not present

## 2018-03-03 DIAGNOSIS — J9610 Chronic respiratory failure, unspecified whether with hypoxia or hypercapnia: Secondary | ICD-10-CM | POA: Diagnosis not present

## 2018-03-03 DIAGNOSIS — I7121 Aneurysm of the ascending aorta, without rupture: Secondary | ICD-10-CM | POA: Diagnosis present

## 2018-03-03 DIAGNOSIS — J439 Emphysema, unspecified: Secondary | ICD-10-CM | POA: Diagnosis not present

## 2018-03-03 DIAGNOSIS — I82629 Acute embolism and thrombosis of deep veins of unspecified upper extremity: Secondary | ICD-10-CM | POA: Diagnosis not present

## 2018-03-03 DIAGNOSIS — E785 Hyperlipidemia, unspecified: Secondary | ICD-10-CM | POA: Diagnosis present

## 2018-03-03 DIAGNOSIS — Z720 Tobacco use: Secondary | ICD-10-CM | POA: Diagnosis not present

## 2018-03-03 DIAGNOSIS — L89156 Pressure-induced deep tissue damage of sacral region: Secondary | ICD-10-CM | POA: Diagnosis present

## 2018-03-03 DIAGNOSIS — J9620 Acute and chronic respiratory failure, unspecified whether with hypoxia or hypercapnia: Secondary | ICD-10-CM | POA: Diagnosis not present

## 2018-03-03 DIAGNOSIS — R0602 Shortness of breath: Secondary | ICD-10-CM | POA: Diagnosis not present

## 2018-03-03 DIAGNOSIS — I739 Peripheral vascular disease, unspecified: Secondary | ICD-10-CM | POA: Diagnosis present

## 2018-03-03 DIAGNOSIS — I82C11 Acute embolism and thrombosis of right internal jugular vein: Secondary | ICD-10-CM | POA: Diagnosis not present

## 2018-03-03 DIAGNOSIS — R4182 Altered mental status, unspecified: Secondary | ICD-10-CM | POA: Diagnosis not present

## 2018-03-03 DIAGNOSIS — L89616 Pressure-induced deep tissue damage of right heel: Secondary | ICD-10-CM | POA: Diagnosis present

## 2018-03-03 DIAGNOSIS — E46 Unspecified protein-calorie malnutrition: Secondary | ICD-10-CM | POA: Diagnosis present

## 2018-03-03 DIAGNOSIS — J441 Chronic obstructive pulmonary disease with (acute) exacerbation: Secondary | ICD-10-CM | POA: Diagnosis not present

## 2018-03-03 DIAGNOSIS — I7102 Dissection of abdominal aorta: Secondary | ICD-10-CM | POA: Diagnosis present

## 2018-03-03 DIAGNOSIS — C3491 Malignant neoplasm of unspecified part of right bronchus or lung: Secondary | ICD-10-CM | POA: Diagnosis present

## 2018-03-03 DIAGNOSIS — R410 Disorientation, unspecified: Secondary | ICD-10-CM | POA: Diagnosis not present

## 2018-03-03 DIAGNOSIS — R5381 Other malaise: Secondary | ICD-10-CM | POA: Diagnosis not present

## 2018-03-03 DIAGNOSIS — M7989 Other specified soft tissue disorders: Secondary | ICD-10-CM | POA: Diagnosis not present

## 2018-03-03 DIAGNOSIS — Z7401 Bed confinement status: Secondary | ICD-10-CM | POA: Diagnosis not present

## 2018-03-03 DIAGNOSIS — I700 Atherosclerosis of aorta: Secondary | ICD-10-CM | POA: Diagnosis not present

## 2018-03-03 DIAGNOSIS — J9600 Acute respiratory failure, unspecified whether with hypoxia or hypercapnia: Secondary | ICD-10-CM

## 2018-03-03 DIAGNOSIS — E1151 Type 2 diabetes mellitus with diabetic peripheral angiopathy without gangrene: Secondary | ICD-10-CM | POA: Diagnosis present

## 2018-03-03 DIAGNOSIS — L89626 Pressure-induced deep tissue damage of left heel: Secondary | ICD-10-CM | POA: Diagnosis present

## 2018-03-03 DIAGNOSIS — M2550 Pain in unspecified joint: Secondary | ICD-10-CM | POA: Diagnosis not present

## 2018-03-03 DIAGNOSIS — E7800 Pure hypercholesterolemia, unspecified: Secondary | ICD-10-CM | POA: Diagnosis present

## 2018-03-03 DIAGNOSIS — L89322 Pressure ulcer of left buttock, stage 2: Secondary | ICD-10-CM | POA: Diagnosis not present

## 2018-03-03 HISTORY — DX: Acute embolism and thrombosis of deep veins of unspecified upper extremity: I82.629

## 2018-03-03 HISTORY — DX: Dissection of ascending aorta: I71.010

## 2018-03-03 HISTORY — DX: Dissection of thoracic aorta: I71.01

## 2018-03-04 LAB — CBC
HCT: 39.4 % (ref 39.0–52.0)
Hemoglobin: 12.8 g/dL — ABNORMAL LOW (ref 13.0–17.0)
MCH: 33.1 pg (ref 26.0–34.0)
MCHC: 32.5 g/dL (ref 30.0–36.0)
MCV: 101.8 fL — ABNORMAL HIGH (ref 80.0–100.0)
Platelets: 107 10*3/uL — ABNORMAL LOW (ref 150–400)
RBC: 3.87 MIL/uL — AB (ref 4.22–5.81)
RDW: 17.2 % — ABNORMAL HIGH (ref 11.5–15.5)
WBC: 16.7 10*3/uL — AB (ref 4.0–10.5)
nRBC: 0 % (ref 0.0–0.2)

## 2018-03-04 LAB — BASIC METABOLIC PANEL
ANION GAP: 8 (ref 5–15)
BUN: 19 mg/dL (ref 8–23)
CO2: 44 mmol/L — ABNORMAL HIGH (ref 22–32)
Calcium: 9.4 mg/dL (ref 8.9–10.3)
Chloride: 93 mmol/L — ABNORMAL LOW (ref 98–111)
Creatinine, Ser: 0.72 mg/dL (ref 0.61–1.24)
GFR calc Af Amer: >60 mL/min (ref >60)
GFR calc non Af Amer: >60 mL/min (ref >60)
Glucose, Bld: 124 mg/dL — ABNORMAL HIGH (ref 70–99)
POTASSIUM: 3.6 mmol/L (ref 3.5–5.1)
SODIUM: 145 mmol/L (ref 135–145)

## 2018-03-05 ENCOUNTER — Other Ambulatory Visit (HOSPITAL_COMMUNITY): Payer: Medicare Other

## 2018-03-05 DIAGNOSIS — J449 Chronic obstructive pulmonary disease, unspecified: Secondary | ICD-10-CM

## 2018-03-05 DIAGNOSIS — C3491 Malignant neoplasm of unspecified part of right bronchus or lung: Secondary | ICD-10-CM

## 2018-03-05 DIAGNOSIS — J90 Pleural effusion, not elsewhere classified: Secondary | ICD-10-CM

## 2018-03-05 DIAGNOSIS — R4182 Altered mental status, unspecified: Secondary | ICD-10-CM | POA: Diagnosis not present

## 2018-03-05 DIAGNOSIS — Z789 Other specified health status: Secondary | ICD-10-CM

## 2018-03-05 DIAGNOSIS — J181 Lobar pneumonia, unspecified organism: Secondary | ICD-10-CM

## 2018-03-05 DIAGNOSIS — I700 Atherosclerosis of aorta: Secondary | ICD-10-CM | POA: Diagnosis not present

## 2018-03-05 DIAGNOSIS — J9621 Acute and chronic respiratory failure with hypoxia: Secondary | ICD-10-CM

## 2018-03-05 DIAGNOSIS — J439 Emphysema, unspecified: Secondary | ICD-10-CM | POA: Diagnosis not present

## 2018-03-05 DIAGNOSIS — K449 Diaphragmatic hernia without obstruction or gangrene: Secondary | ICD-10-CM | POA: Diagnosis not present

## 2018-03-05 DIAGNOSIS — J9622 Acute and chronic respiratory failure with hypercapnia: Secondary | ICD-10-CM

## 2018-03-05 DIAGNOSIS — Z4682 Encounter for fitting and adjustment of non-vascular catheter: Secondary | ICD-10-CM | POA: Diagnosis not present

## 2018-03-05 LAB — BLOOD GAS, ARTERIAL
Acid-Base Excess: 19.3 mmol/L — ABNORMAL HIGH (ref 0.0–2.0)
Acid-Base Excess: 19.9 mmol/L — ABNORMAL HIGH (ref 0.0–2.0)
Acid-Base Excess: 17.9 mmol/L — ABNORMAL HIGH (ref 0.0–2.0)
Bicarbonate: 46.4 mmol/L — ABNORMAL HIGH (ref 20.0–28.0)
Bicarbonate: 46.3 mmol/L — ABNORMAL HIGH (ref 20.0–28.0)
Bicarbonate: 44.2 mmol/L — ABNORMAL HIGH (ref 20.0–28.0)
Delivery systems: POSITIVE
Expiratory PAP: 8
FIO2: 60
FIO2: 0.6
FIO2: 70
Inspiratory PAP: 16
MECHVT: 400 mL
Mode: POSITIVE
O2 Saturation: 92.8 %
O2 Saturation: 91.2 %
O2 Saturation: 99.1 %
PATIENT TEMPERATURE: 98.6
PCO2 ART: 79.8 mmHg — AB (ref 32.0–48.0)
PEEP: 5 cmH2O
PEEP: 5 cmH2O
PH ART: 7.361 (ref 7.350–7.450)
PO2 ART: 59.8 mmHg — AB (ref 83.0–108.0)
Patient temperature: 98.6
Patient temperature: 98.6
Pressure support: 12 cmH2O
RATE: 20 resp/min
pCO2 arterial: 94 mmHg (ref 32.0–48.0)
pCO2 arterial: 80.1 mmHg (ref 32.0–48.0)
pH, Arterial: 7.314 — ABNORMAL LOW (ref 7.350–7.450)
pH, Arterial: 7.381 (ref 7.350–7.450)
pO2, Arterial: 68.7 mmHg — ABNORMAL LOW (ref 83.0–108.0)
pO2, Arterial: 177 mmHg — ABNORMAL HIGH (ref 83.0–108.0)

## 2018-03-05 LAB — TSH: TSH: 1.392 u[IU]/mL (ref 0.350–4.500)

## 2018-03-05 LAB — T4, FREE: Free T4: 0.79 ng/dL — ABNORMAL LOW (ref 0.82–1.77)

## 2018-03-05 MED ORDER — IOPAMIDOL (ISOVUE-370) INJECTION 76%
100.0000 mL | Freq: Once | INTRAVENOUS | Status: AC | PRN
  Administered 2018-03-05: 100 mL via INTRAVENOUS
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
## 2018-03-06 NOTE — Progress Notes (Signed)
Pulmonary Critical Care Medicine [place]   PULMONARY CRITICAL CARE SERVICE  PROGRESS NOTE  Date of Service: 03/06/2018  [name]  HYQ:[id number]  DOB: 1940/04/13   DOA: 03/03/2018  Referring Physician: Merton Border, MD  HPI: Tommy [name] is a 78 y.o. male seen for follow up of Acute on Chronic Respiratory Failure.  Patient remains orally intubated critically ill in danger of cardiac arrest.  Patient's family was present in the room and they were updated.  Right now he is on 50% FiO2.  I did look at his CT of the abdomen which was done showing some lower lobe consolidation.  Also reviewed the chest x-ray showing significant consolidative changes in both lung fields.  Spoke with the primary care team going to start him on broad-spectrum antibiotics.  Medications: Reviewed on Rounds  Physical Exam:  Vitals: Temperature 98.3 pulse 103 respiratory rate 18 blood pressure 138/89 saturations 97%  Ventilator Settings mode of ventilation assist control FiO2 50% tidal volume 400 PEEP 5  . General: Comfortable at this time . Eyes: Grossly normal lids, irises & conjunctiva . ENT: grossly tongue is normal . Neck: no obvious mass . Cardiovascular: S1 S2 normal no gallop . Respiratory: Scattered rhonchi expansion is equal bilaterally . Abdomen: soft . Skin: no rash seen on limited exam . Musculoskeletal: not rigid . Psychiatric:unable to assess . Neurologic: no seizure no involuntary movements         Lab Data:   Basic Metabolic Panel: Recent Labs  Lab 03/04/18 0649  NA 145  K 3.6  CL 93*  CO2 44*  GLUCOSE 124*  BUN 19  CREATININE 0.72  CALCIUM 9.4    ABG: Recent Labs  Lab 03/05/18 0503 03/05/18 0835 03/05/18 1604  PHART 7.314* 7.381 7.361  PCO2ART 94.0* 79.8* 80.1*  PO2ART 68.7* 59.8* 177*  HCO3 46.4* 46.3* 44.2*  O2SAT 92.8 91.2 99.1    Liver Function Tests: No results for input(s): AST, ALT, ALKPHOS, BILITOT, PROT, ALBUMIN in the last 168  hours. No results for input(s): LIPASE, AMYLASE in the last 168 hours. No results for input(s): AMMONIA in the last 168 hours.  CBC: Recent Labs  Lab 03/04/18 0649  WBC 16.7*  HGB 12.8*  HCT 39.4  MCV 101.8*  PLT 107*    Cardiac Enzymes: No results for input(s): CKTOTAL, CKMB, CKMBINDEX, TROPONINI in the last 168 hours.  BNP (last 3 results) No results for input(s): BNP in the last 8760 hours.  ProBNP (last 3 results) No results for input(s): PROBNP in the last 8760 hours.  Radiological Exams: Ct Head Wo Contrast  Result Date: 03/05/2018 CLINICAL DATA:  Acute onset of altered mental status. EXAM: CT HEAD WITHOUT CONTRAST TECHNIQUE: Contiguous axial images were obtained from the base of the skull through the vertex without intravenous contrast. COMPARISON:  CT of the head performed 02/02/2018 FINDINGS: Brain: No evidence of acute infarction, hemorrhage, hydrocephalus, extra-axial collection or mass lesion / mass effect. Prominence of the ventricles and sulci reflects mild cortical volume loss. Mild cerebellar atrophy is noted. Scattered periventricular subcortical white matter change likely reflects small vessel ischemic microangiopathy. The brainstem and fourth ventricle are within normal limits. The basal ganglia are unremarkable in appearance. The cerebral hemispheres demonstrate grossly normal gray-white differentiation. No mass effect or midline shift is seen. Vascular: No hyperdense vessel or unexpected calcification. Skull: There is no evidence of fracture; visualized osseous structures are unremarkable in appearance. Sinuses/Orbits: The orbits are within normal limits. The paranasal sinuses  and mastoid air cells are well-aerated. Other: No significant soft tissue abnormalities are seen. IMPRESSION: 1. No acute intracranial pathology seen on CT. 2. Mild cortical volume loss and scattered small vessel ischemic microangiopathy. Electronically Signed   By: Garald Balding M.D.   On:  03/05/2018 23:29   Ct Angio Chest Pe W Or Wo Contrast  Result Date: 03/06/2018 CLINICAL DATA:  Pulmonary embolism (PE), suspected post intubation today. History of lung cancer and aortic dissection. EXAM: CT ANGIOGRAPHY CHEST WITH CONTRAST TECHNIQUE: Multidetector CT imaging of the chest was performed using the standard protocol during bolus administration of intravenous contrast. Multiplanar CT image reconstructions and MIPs were obtained to evaluate the vascular anatomy. CONTRAST:  195mL ISOVUE-370 IOPAMIDOL (ISOVUE-370) INJECTION 76% COMPARISON:  Chest radiograph earlier this day. Chest CT PE protocol 02/12/2018 FINDINGS: Cardiovascular: There are no filling defects within the pulmonary arteries to suggest pulmonary embolus. Stable chronic ascending aortic dissection which is fenestrated, ascending aortic dilatation of 5.6 cm image 59 series 5, unchanged for similar caliper placement. Prosthetic aortic valve. No periaortic stranding. Right internal jugular central venous catheter tip in the distal SVC. The heart is normal in size. No pericardial effusion. Mediastinum/Nodes: Large hiatal hernia with greater than 75% of the stomach intrathoracic, enteric tube is in the distal stomach, tip in the region of the diaphragmatic hiatus. 13 mm node the highest mediastinum image 41 series 5, is new from prior exam. Calcified nodes in the right hilar region again seen. Lungs/Pleura: Partially loculated right apical pleural effusion is not significantly changed from prior exam. New dependent pleural effusions bilaterally, small in size with adjacent atelectasis. Right hilar soft tissue density is unchanged and consistent with postradiation change. Associated right perihilar scarring, unchanged. New reticulonodular opacities in the right middle lobe and left lower lobe from prior. 9 mm lingular nodule image 94 series 6, unchanged. Left lower lobe pleural based nodule spanning 10 mm, partially obscured by adjacent pleural  fluid, image 92 series 6. Moderate emphysema. Upper Abdomen: Streak artifact from high-density barium in the colon partially obscures evaluation of the upper abdomen. There is bilateral perinephric edema but appears similar to prior. Calcifications in the pancreatic head. Musculoskeletal: Recent left first rib fracture with decreasing surrounding hematoma. No new osseous abnormality. Multilevel degenerative change in the spine. Post median sternotomy. Review of the MIP images confirms the above findings. IMPRESSION: 1. No pulmonary embolus. 2. New small bilateral pleural effusions with adjacent atelectasis compared to 02/12/2018 CT. New reticulonodular opacities in the right middle lobe and left lower lobe may be infectious or inflammatory. Aspiration is also considered. 3. Continued decrease in surrounding hematoma about subacute left first rib fracture. 4. Large hiatal hernia with greater than 75% of the stomach intrathoracic, enteric tube in the stomach with tip in the region of the diaphragmatic hiatus. 5. 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 6. Chronic postradiation change in the right perihilar lung. 7. Aortic Atherosclerosis (ICD10-I70.0) and Emphysema (ICD10-J43.9). Electronically Signed   By: Keith Rake M.D.   On: 03/06/2018 00:16   Dg Chest Port 1 View  Result Date: 03/05/2018 CLINICAL DATA:  Status post intubation today. History of lung cancer and aortic dissection. EXAM: PORTABLE CHEST 1 VIEW COMPARISON:  Single-view of the chest 02/27/2018 and 10/29/2017. CT chest 02/12/2018. FINDINGS: Endotracheal tube is in place with the tip in good position at the clavicular heads. Right subclavian Port-A-Cath is unchanged with its tip in the lower superior vena cava. The lungs are emphysematous.  There has been marked worsening of airspace disease in the right mid and lower lung zones. The patient has a moderate right pleural effusion. Small to  moderate left pleural effusion and basilar airspace disease have also progressed since the most recent exam. No pneumothorax. Right perihilar opacity consistent with known lung carcinoma noted. IMPRESSION: ETT in good position. Increased right worse than left pleural effusions and airspace disease which could be atelectasis or pneumonia. Emphysema. Right perihilar opacity correlates with history of lung cancer. Electronically Signed   By: Inge Rise M.D.   On: 03/05/2018 15:45   Dg Abd Portable 1v  Result Date: 03/05/2018 CLINICAL DATA:  NG tube placement. EXAM: PORTABLE ABDOMEN - 1 VIEW COMPARISON:  CT 02/12/2018 FINDINGS: Tip and side port of the enteric tube likely within a large hiatal hernia as seen on prior CT. Almost the entire stomach is above the diaphragm on CT. High-density material throughout the colon, presumed enteric contrast. No bowel obstruction. Aorto bi-iliac stent graft in place. Pancreatic calcifications on prior CT are not as well demonstrated radiographically. Right hip arthroplasty. IMPRESSION: Tip and side port of the enteric tube likely within a large hiatal hernia as seen on prior CT. Almost the entire stomach is intrathoracic on prior CT. Nonobstructive bowel gas pattern, enteric contrast throughout the colon. Electronically Signed   By: Keith Rake M.D.   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 Acute on chronic respiratory failure with hypoxia patient is going to continue with full vent support for now patient is orally intubated with high risk airway.  He is requiring sedation.  Right now is on assist control FiO2 50% tidal volume 400 PEEP 5 which will be continued we will monitor ABGs periodically. 2. Lobar pneumonia x-ray shows basilar  consolidation.  Patient will be started on antibiotics discussed on rounds with the primary care team. 3. Recurring squamous cell carcinoma of the right lung overall prognosis is poor discussed this finding with the family's.  They do understand that his overall prognosis is poor.  We are going to see how he does for the next 2 weeks and then we will have a conversation. 4. Severe emphysema COPD patient me at the CT scan has obvious severe COPD changes.  Patient needs to be continued on current management. 5. Bilateral effusions we will continue to monitor follow-up x-ray   I have personally seen and evaluated the patient, evaluated laboratory and imaging results, formulated the assessment and plan and placed orders.  Time 35 minutes patient is critically ill in danger of cardiac arrest and death has a high risk airway.  Discussed on rounds with the treatment team and primary care physician The Patient requires high complexity decision making for assessment and support.  Case was discussed on Rounds with the Respiratory Therapy Staff  Allyne Gee, MD [facility] Pulmonary Critical Care Medicine Sleep Medicine

## 2018-03-07 LAB — CBC
HCT: 36.4 % — ABNORMAL LOW (ref 39.0–52.0)
Hemoglobin: 11.8 g/dL — ABNORMAL LOW (ref 13.0–17.0)
MCH: 33.5 pg (ref 26.0–34.0)
MCHC: 32.4 g/dL (ref 30.0–36.0)
MCV: 103.4 fL — ABNORMAL HIGH (ref 80.0–100.0)
Platelets: 82 10*3/uL — ABNORMAL LOW (ref 150–400)
RBC: 3.52 MIL/uL — ABNORMAL LOW (ref 4.22–5.81)
RDW: 17.3 % — ABNORMAL HIGH (ref 11.5–15.5)
WBC: 17.6 10*3/uL — AB (ref 4.0–10.5)
nRBC: 0 % (ref 0.0–0.2)

## 2018-03-07 LAB — BLOOD GAS, ARTERIAL
Acid-Base Excess: 13.6 mmol/L — ABNORMAL HIGH (ref 0.0–2.0)
Bicarbonate: 39.3 mmol/L — ABNORMAL HIGH (ref 20.0–28.0)
Delivery systems: POSITIVE
Expiratory PAP: 6
FIO2: 35
Inspiratory PAP: 12
O2 SAT: 94.5 %
PATIENT TEMPERATURE: 98.6
pCO2 arterial: 68.3 mmHg (ref 32.0–48.0)
pH, Arterial: 7.378 (ref 7.350–7.450)
pO2, Arterial: 74.6 mmHg — ABNORMAL LOW (ref 83.0–108.0)

## 2018-03-07 LAB — BASIC METABOLIC PANEL
ANION GAP: 4 — AB (ref 5–15)
BUN: 37 mg/dL — ABNORMAL HIGH (ref 8–23)
CO2: 36 mmol/L — ABNORMAL HIGH (ref 22–32)
Calcium: 9 mg/dL (ref 8.9–10.3)
Chloride: 102 mmol/L (ref 98–111)
Creatinine, Ser: 0.62 mg/dL (ref 0.61–1.24)
GFR calc non Af Amer: >60 mL/min (ref >60)
Glucose, Bld: 223 mg/dL — ABNORMAL HIGH (ref 70–99)
POTASSIUM: 4.2 mmol/L (ref 3.5–5.1)
Sodium: 142 mmol/L (ref 135–145)

## 2018-03-07 LAB — VANCOMYCIN, TROUGH: Vancomycin Tr: 13 ug/mL — ABNORMAL LOW (ref 15–20)

## 2018-03-07 NOTE — Progress Notes (Signed)
Pulmonary Critical Care Medicine [place]   PULMONARY CRITICAL CARE SERVICE  PROGRESS NOTE  Date of Service: 03/07/2018  AHMAR PICKRELL  JYN:[id number]  DOB: Jul 15, 1940   DOA: 03/03/2018  Referring Physician: Merton Border, MD  HPI: Tommy [name] is a 78 y.o. male seen for follow up of Acute on Chronic Respiratory Failure.  Patient is on wean today remains orally intubated patient was started on pressure support mode on 40% FiO2 he remains nonverbal nonresponsive.  Family was present in the room and they were updated.  Medications: Reviewed on Rounds  Physical Exam:  Vitals: Temperature 97.3 pulse 100 respiratory 26 blood pressure 155/87 saturations 99%  Ventilator Settings mode ventilation pressure support FiO2 40% tidal volume 400 pressure support 12 PEEP 5  . General: Comfortable at this time . Eyes: Grossly normal lids, irises & conjunctiva . ENT: grossly tongue is normal . Neck: no obvious mass . Cardiovascular: S1 S2 normal no gallop . Respiratory: No rhonchi or rales are noted at this time . Abdomen: soft . Skin: no rash seen on limited exam . Musculoskeletal: not rigid . Psychiatric:unable to assess . Neurologic: no seizure no involuntary movements         Lab Data:   Basic Metabolic Panel: Recent Labs  Lab 03/04/18 0649 03/07/18 0742  NA 145 142  K 3.6 4.2  CL 93* 102  CO2 44* 36*  GLUCOSE 124* 223*  BUN 19 37*  CREATININE 0.72 0.62  CALCIUM 9.4 9.0    ABG: Recent Labs  Lab 03/05/18 0503 03/05/18 0835 03/05/18 1604  PHART 7.314* 7.381 7.361  PCO2ART 94.0* 79.8* 80.1*  PO2ART 68.7* 59.8* 177*  HCO3 46.4* 46.3* 44.2*  O2SAT 92.8 91.2 99.1    Liver Function Tests: No results for input(s): AST, ALT, ALKPHOS, BILITOT, PROT, ALBUMIN in the last 168 hours. No results for input(s): LIPASE, AMYLASE in the last 168 hours. No results for input(s): AMMONIA in the last 168 hours.  CBC: Recent Labs  Lab 03/04/18 0649 03/07/18 0742   WBC 16.7* 17.6*  HGB 12.8* 11.8*  HCT 39.4 36.4*  MCV 101.8* 103.4*  PLT 107* 82*    Cardiac Enzymes: No results for input(s): CKTOTAL, CKMB, CKMBINDEX, TROPONINI in the last 168 hours.  BNP (last 3 results) No results for input(s): BNP in the last 8760 hours.  ProBNP (last 3 results) No results for input(s): PROBNP in the last 8760 hours.  Radiological Exams: Ct Head Wo Contrast  Result Date: 03/05/2018 CLINICAL DATA:  Acute onset of altered mental status. EXAM: CT HEAD WITHOUT CONTRAST TECHNIQUE: Contiguous axial images were obtained from the base of the skull through the vertex without intravenous contrast. COMPARISON:  CT of the head performed 02/02/2018 FINDINGS: Brain: No evidence of acute infarction, hemorrhage, hydrocephalus, extra-axial collection or mass lesion / mass effect. Prominence of the ventricles and sulci reflects mild cortical volume loss. Mild cerebellar atrophy is noted. Scattered periventricular subcortical white matter change likely reflects small vessel ischemic microangiopathy. The brainstem and fourth ventricle are within normal limits. The basal ganglia are unremarkable in appearance. The cerebral hemispheres demonstrate grossly normal gray-white differentiation. No mass effect or midline shift is seen. Vascular: No hyperdense vessel or unexpected calcification. Skull: There is no evidence of fracture; visualized osseous structures are unremarkable in appearance. Sinuses/Orbits: The orbits are within normal limits. The paranasal sinuses and mastoid air cells are well-aerated. Other: No significant soft tissue abnormalities are seen. IMPRESSION: 1. No acute intracranial pathology seen on CT.  2. Mild cortical volume loss and scattered small vessel ischemic microangiopathy. Electronically Signed   By: Garald Balding M.D.   On: 03/05/2018 23:29   Ct Angio Chest Pe W Or Wo Contrast  Result Date: 03/06/2018 CLINICAL DATA:  Pulmonary embolism (PE), suspected post  intubation today. History of lung cancer and aortic dissection. EXAM: CT ANGIOGRAPHY CHEST WITH CONTRAST TECHNIQUE: Multidetector CT imaging of the chest was performed using the standard protocol during bolus administration of intravenous contrast. Multiplanar CT image reconstructions and MIPs were obtained to evaluate the vascular anatomy. CONTRAST:  171mL ISOVUE-370 IOPAMIDOL (ISOVUE-370) INJECTION 76% COMPARISON:  Chest radiograph earlier this day. Chest CT PE protocol 02/12/2018 FINDINGS: Cardiovascular: There are no filling defects within the pulmonary arteries to suggest pulmonary embolus. Stable chronic ascending aortic dissection which is fenestrated, ascending aortic dilatation of 5.6 cm image 59 series 5, unchanged for similar caliper placement. Prosthetic aortic valve. No periaortic stranding. Right internal jugular central venous catheter tip in the distal SVC. The heart is normal in size. No pericardial effusion. Mediastinum/Nodes: Large hiatal hernia with greater than 75% of the stomach intrathoracic, enteric tube is in the distal stomach, tip in the region of the diaphragmatic hiatus. 13 mm node the highest mediastinum image 41 series 5, is new from prior exam. Calcified nodes in the right hilar region again seen. Lungs/Pleura: Partially loculated right apical pleural effusion is not significantly changed from prior exam. New dependent pleural effusions bilaterally, small in size with adjacent atelectasis. Right hilar soft tissue density is unchanged and consistent with postradiation change. Associated right perihilar scarring, unchanged. New reticulonodular opacities in the right middle lobe and left lower lobe from prior. 9 mm lingular nodule image 94 series 6, unchanged. Left lower lobe pleural based nodule spanning 10 mm, partially obscured by adjacent pleural fluid, image 92 series 6. Moderate emphysema. Upper Abdomen: Streak artifact from high-density barium in the colon partially obscures  evaluation of the upper abdomen. There is bilateral perinephric edema but appears similar to prior. Calcifications in the pancreatic head. Musculoskeletal: Recent left first rib fracture with decreasing surrounding hematoma. No new osseous abnormality. Multilevel degenerative change in the spine. Post median sternotomy. Review of the MIP images confirms the above findings. IMPRESSION: 1. No pulmonary embolus. 2. New small bilateral pleural effusions with adjacent atelectasis compared to 02/12/2018 CT. New reticulonodular opacities in the right middle lobe and left lower lobe may be infectious or inflammatory. Aspiration is also considered. 3. Continued decrease in surrounding hematoma about subacute left first rib fracture. 4. Large hiatal hernia with greater than 75% of the stomach intrathoracic, enteric tube in the stomach with tip in the region of the diaphragmatic hiatus. 5. 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 6. Chronic postradiation change in the right perihilar lung. 7. Aortic Atherosclerosis (ICD10-I70.0) and Emphysema (ICD10-J43.9). Electronically Signed   By: Keith Rake M.D.   On: 03/06/2018 00:16   Dg Chest Port 1 View  Result Date: 03/05/2018 CLINICAL DATA:  Status post intubation today. History of lung cancer and aortic dissection. EXAM: PORTABLE CHEST 1 VIEW COMPARISON:  Single-view of the chest 02/27/2018 and 10/29/2017. CT chest 02/12/2018. FINDINGS: Endotracheal tube is in place with the tip in good position at the clavicular heads. Right subclavian Port-A-Cath is unchanged with its tip in the lower superior vena cava. The lungs are emphysematous. There has been marked worsening of airspace disease in the right mid and lower lung zones. The patient has a moderate right pleural  effusion. Small to moderate left pleural effusion and basilar airspace disease have also progressed since the most recent exam. No pneumothorax. Right  perihilar opacity consistent with known lung carcinoma noted. IMPRESSION: ETT in good position. Increased right worse than left pleural effusions and airspace disease which could be atelectasis or pneumonia. Emphysema. Right perihilar opacity correlates with history of lung cancer. Electronically Signed   By: Inge Rise M.D.   On: 03/05/2018 15:45   Dg Abd Portable 1v  Result Date: 03/05/2018 CLINICAL DATA:  NG tube placement. EXAM: PORTABLE ABDOMEN - 1 VIEW COMPARISON:  CT 02/12/2018 FINDINGS: Tip and side port of the enteric tube likely within a large hiatal hernia as seen on prior CT. Almost the entire stomach is above the diaphragm on CT. High-density material throughout the colon, presumed enteric contrast. No bowel obstruction. Aorto bi-iliac stent graft in place. Pancreatic calcifications on prior CT are not as well demonstrated radiographically. Right hip arthroplasty. IMPRESSION: Tip and side port of the enteric tube likely within a large hiatal hernia as seen on prior CT. Almost the entire stomach is intrathoracic on prior CT. Nonobstructive bowel gas pattern, enteric contrast throughout the colon. Electronically Signed   By: Keith Rake M.D.   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 Acute on chronic respiratory failure with hypoxia patient has been started on wean protocol in pressure support mode as he was able to pass the RSB I.  The patient currently is going to go for a goal of 4 hours on pressure support.  I will try to titrate the FiO2 down further. 2. COPD severe disease we will continue with present management again spoke to the family and made sure they understand he has severe COPD and he may have a great deal of difficulty weaning.  Right now they  want everything done. 3. Lobar pneumonia treated we will continue with supportive care the x-ray studies were reviewed in detail with the family at the bedside. 4. Bilateral effusions follow-up x-ray 5. Recurrent squamous cell carcinoma of the lung prognosis guarded   I have personally seen and evaluated the patient, evaluated laboratory and imaging results, formulated the assessment and plan and placed orders.  Time 35 minutes patient is critically ill in danger of cardiac arrest and death with a high risk airway patient is orally intubated. The Patient requires high complexity decision making for assessment and support.  Case was discussed on Rounds with the Respiratory Therapy Staff  Allyne Gee, MD [facility] Pulmonary Critical Care Medicine Sleep Medicine

## 2018-03-08 LAB — BLOOD GAS, ARTERIAL
Acid-Base Excess: 13.2 mmol/L — ABNORMAL HIGH (ref 0.0–2.0)
Bicarbonate: 38.8 mmol/L — ABNORMAL HIGH (ref 20.0–28.0)
O2 Content: 4 L/min
O2 Saturation: 96.4 %
Patient temperature: 98.6
pCO2 arterial: 66.5 mmHg (ref 32.0–48.0)
pH, Arterial: 7.385 (ref 7.350–7.450)
pO2, Arterial: 85 mmHg (ref 83.0–108.0)

## 2018-03-08 NOTE — Progress Notes (Signed)
Pulmonary Critical Care Medicine [place]   PULMONARY CRITICAL CARE SERVICE  PROGRESS NOTE  Date of Service: 03/08/2018  Tommy [name]  [id number]  DOB: 1940-11-30   DOA: 03/03/2018  Referring Physician: Merton Border, MD  HPI: Tommy [name] is a 78 y.o. male seen for follow up of Acute on Chronic Respiratory Failure.  Patient apparently self extubated.  He was placed back on BiPAP.  Follow-up ABG was done which looks okay.  Right now he is awake.  We are going to try to see about taking him off of the BiPAP.  Medications: Reviewed on Rounds  Physical Exam:  Vitals: Temperature 97.4 pulse 91 respiratory rate 22 blood pressure 144/83 saturations 97%  Ventilator Settings BiPAP 15/6 FiO2 35% tidal volume 461  . General: Comfortable at this time . Eyes: Grossly normal lids, irises & conjunctiva . ENT: grossly tongue is normal . Neck: no obvious mass . Cardiovascular: S1 S2 normal no gallop . Respiratory: No rhonchi or rales are noted at this time . Abdomen: soft . Skin: no rash seen on limited exam . Musculoskeletal: not rigid . Psychiatric:unable to assess . Neurologic: no seizure no involuntary movements         Lab Data:   Basic Metabolic Panel: Recent Labs  Lab 03/04/18 0649 03/07/18 0742  NA 145 142  K 3.6 4.2  CL 93* 102  CO2 44* 36*  GLUCOSE 124* 223*  BUN 19 37*  CREATININE 0.72 0.62  CALCIUM 9.4 9.0    ABG: Recent Labs  Lab 03/05/18 0503 03/05/18 0835 03/05/18 1604 03/07/18 1707  PHART 7.314* 7.381 7.361 7.378  PCO2ART 94.0* 79.8* 80.1* 68.3*  PO2ART 68.7* 59.8* 177* 74.6*  HCO3 46.4* 46.3* 44.2* 39.3*  O2SAT 92.8 91.2 99.1 94.5    Liver Function Tests: No results for input(s): AST, ALT, ALKPHOS, BILITOT, PROT, ALBUMIN in the last 168 hours. No results for input(s): LIPASE, AMYLASE in the last 168 hours. No results for input(s): AMMONIA in the last 168 hours.  CBC: Recent Labs  Lab 03/04/18 0649 03/07/18 0742  WBC  16.7* 17.6*  HGB 12.8* 11.8*  HCT 39.4 36.4*  MCV 101.8* 103.4*  PLT 107* 82*    Cardiac Enzymes: No results for input(s): CKTOTAL, CKMB, CKMBINDEX, TROPONINI in the last 168 hours.  BNP (last 3 results) No results for input(s): BNP in the last 8760 hours.  ProBNP (last 3 results) No results for input(s): PROBNP in the last 8760 hours.  Radiological Exams: No results found.  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 Acute on chronic respiratory failure with hypoxia patient self extubated.  We will continue to monitor him closely he is done well now for about 24 hours off BiPAP. 2. COPD severe disease continue with present management 3. Recurrent squamous cell carcinoma of the lung will need follow-up after discharge. 4. Lobar pneumonia treated we will continue to follow chest x-ray 5. Bilateral effusions stable follow-up x-ray   I have personally seen and evaluated the patient, evaluated laboratory and imaging results, formulated the assessment and plan and placed orders. The Patient requires high complexity decision making for assessment and support.  Case was discussed on Rounds with the Respiratory Therapy Staff  Allyne Gee, MD [facility] Pulmonary Critical Care Medicine Sleep Medicine

## 2018-03-09 ENCOUNTER — Other Ambulatory Visit (HOSPITAL_COMMUNITY): Payer: Medicare Other

## 2018-03-09 LAB — VANCOMYCIN, TROUGH: Vancomycin Tr: 15 ug/mL (ref 15–20)

## 2018-03-09 NOTE — Progress Notes (Signed)
Pulmonary Critical Care Medicine [place]   PULMONARY CRITICAL CARE SERVICE  PROGRESS NOTE  Date of Service: 03/09/2018  Tommy [name]  YJE:[id number]  DOB: 13-Mar-1940   DOA: 03/03/2018  Referring Physician: Merton Border, MD  HPI: Tommy [name] is a 78 y.o. male seen for follow up of Acute on Chronic Respiratory Failure.  Patient is on nasal cannula he is more awake appears to be comfortable without distress off of the BiPAP  Medications: Reviewed on Rounds  Physical Exam:  Vitals: Temperature 97.3 pulse 75 respiratory 24 blood pressure 137/79 saturations 98%  Ventilator Settings off the ventilator now on nasal cannula  . General: Comfortable at this time . Eyes: Grossly normal lids, irises & conjunctiva . ENT: grossly tongue is normal . Neck: no obvious mass . Cardiovascular: S1 S2 normal no gallop . Respiratory: No rhonchi no rales are noted at this time . Abdomen: soft . Skin: no rash seen on limited exam . Musculoskeletal: not rigid . Psychiatric:unable to assess . Neurologic: no seizure no involuntary movements         Lab Data:   Basic Metabolic Panel: Recent Labs  Lab 03/04/18 0649 03/07/18 0742  NA 145 142  K 3.6 4.2  CL 93* 102  CO2 44* 36*  GLUCOSE 124* 223*  BUN 19 37*  CREATININE 0.72 0.62  CALCIUM 9.4 9.0    ABG: Recent Labs  Lab 03/05/18 0503 03/05/18 0835 03/05/18 1604 03/07/18 1707 03/08/18 1110  PHART 7.314* 7.381 7.361 7.378 7.385  PCO2ART 94.0* 79.8* 80.1* 68.3* 66.5*  PO2ART 68.7* 59.8* 177* 74.6* 85.0  HCO3 46.4* 46.3* 44.2* 39.3* 38.8*  O2SAT 92.8 91.2 99.1 94.5 96.4    Liver Function Tests: No results for input(s): AST, ALT, ALKPHOS, BILITOT, PROT, ALBUMIN in the last 168 hours. No results for input(s): LIPASE, AMYLASE in the last 168 hours. No results for input(s): AMMONIA in the last 168 hours.  CBC: Recent Labs  Lab 03/04/18 0649 03/07/18 0742  WBC 16.7* 17.6*  HGB 12.8* 11.8*  HCT 39.4 36.4*   MCV 101.8* 103.4*  PLT 107* 82*    Cardiac Enzymes: No results for input(s): CKTOTAL, CKMB, CKMBINDEX, TROPONINI in the last 168 hours.  BNP (last 3 results) No results for input(s): BNP in the last 8760 hours.  ProBNP (last 3 results) No results for input(s): PROBNP in the last 8760 hours.  Radiological Exams: No results found.  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 Acute on chronic respiratory failure with hypoxia we will continue with nasal cannula continue to titrate the oxygen as tolerated continue also on BiPAP as needed. 2. Recurrent squamous cell carcinoma of the lung continue with supportive care 3. COPD severe disease end-stage 4. Lobar pneumonia treated 5. Bilateral effusion follow x-ray as necessary   I have personally seen and evaluated the patient, evaluated laboratory and imaging results, formulated the assessment and plan and placed orders. The Patient requires high complexity decision making for assessment and support.  Case was discussed on Rounds with the Respiratory Therapy Staff  Allyne Gee, MD [facility] Pulmonary Critical Care Medicine Sleep Medicine

## 2018-03-10 NOTE — Progress Notes (Signed)
Pulmonary Critical Care Medicine [place]   PULMONARY CRITICAL CARE SERVICE  PROGRESS NOTE  Date of Service: 03/10/2018  Tommy [name]  TJQ:[id number]  DOB: [birth year]   DOA: 03/03/2018  Referring Physician: Merton Border, MD  HPI: Tommy [name] is a 78 y.o. male seen for follow up of Acute on Chronic Respiratory Failure.  Patient is currently off the ventilator he is on 4 L oxygen saturations are good patient has been tolerating BiPAP at nighttime intermittently  Medications: Reviewed on Rounds  Physical Exam:  Vitals: Temperature 96.4 pulse 96 respiratory rate 13 blood pressure 147/81 saturations 95%  Ventilator Settings off the ventilator right now and off the BiPAP  . General: Comfortable at this time . Eyes: Grossly normal lids, irises & conjunctiva . ENT: grossly tongue is normal . Neck: no obvious mass . Cardiovascular: S1 S2 normal no gallop . Respiratory: No rhonchi or rales are noted at this time . Abdomen: soft . Skin: no rash seen on limited exam . Musculoskeletal: not rigid . Psychiatric:unable to assess . Neurologic: no seizure no involuntary movements         Lab Data:   Basic Metabolic Panel: Recent Labs  Lab 03/04/18 0649 03/07/18 0742  NA 145 142  K 3.6 4.2  CL 93* 102  CO2 44* 36*  GLUCOSE 124* 223*  BUN 19 37*  CREATININE 0.72 0.62  CALCIUM 9.4 9.0    ABG: Recent Labs  Lab 03/05/18 0503 03/05/18 0835 03/05/18 1604 03/07/18 1707 03/08/18 1110  PHART 7.314* 7.381 7.361 7.378 7.385  PCO2ART 94.0* 79.8* 80.1* 68.3* 66.5*  PO2ART 68.7* 59.8* 177* 74.6* 85.0  HCO3 46.4* 46.3* 44.2* 39.3* 38.8*  O2SAT 92.8 91.2 99.1 94.5 96.4    Liver Function Tests: No results for input(s): AST, ALT, ALKPHOS, BILITOT, PROT, ALBUMIN in the last 168 hours. No results for input(s): LIPASE, AMYLASE in the last 168 hours. No results for input(s): AMMONIA in the last 168 hours.  CBC: Recent Labs  Lab 03/04/18 0649 03/07/18 0742   WBC 16.7* 17.6*  HGB 12.8* 11.8*  HCT 39.4 36.4*  MCV 101.8* 103.4*  PLT 107* 82*    Cardiac Enzymes: No results for input(s): CKTOTAL, CKMB, CKMBINDEX, TROPONINI in the last 168 hours.  BNP (last 3 results) No results for input(s): BNP in the last 8760 hours.  ProBNP (last 3 results) No results for input(s): PROBNP in the last 8760 hours.  Radiological Exams: No results found.  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 Acute on chronic respiratory failure with hypoxia we will continue with oxygen therapy which will be gradually titrated down. 2. COPD severe disease we will continue with supportive care nebulizers as needed 3. Recurring squamous cell carcinoma of the lung patient is at baseline we will follow-up after discharge 4. Lobar pneumonia treated 5. Pleural effusions continue to monitor radiologically   I have personally seen and evaluated the patient, evaluated laboratory and imaging results, formulated the assessment and plan and placed orders. The Patient requires high complexity decision making for assessment and support.  Case was discussed on Rounds with the Respiratory Therapy Staff  Allyne Gee, MD [facility] Pulmonary Critical Care Medicine Sleep Medicine

## 2018-03-11 NOTE — Progress Notes (Signed)
Pulmonary Critical Care Medicine [place]   PULMONARY CRITICAL CARE SERVICE  PROGRESS NOTE  Date of Service: 03/11/2018  Tommy [name]  [id number]:[id number]  DOB: [birth year]   DOA: 03/03/2018  Referring Physician: Merton Border, MD  HPI: Tommy [name] is a 78 y.o. male seen for follow up of Acute on Chronic Respiratory Failure.  He has managed to stay off the ventilator.  He is on nasal cannula about 4 L at this time.  Using BiPAP at nighttime comfortable right now  Medications: Reviewed on Rounds  Physical Exam:  Vitals: Temperature 97.7 pulse 89 respiratory rate 12 blood pressure 150/86 saturations 96%  Ventilator Settings off of BiPAP and on 4 L  . General: Comfortable at this time . Eyes: Grossly normal lids, irises & conjunctiva . ENT: grossly tongue is normal . Neck: no obvious mass . Cardiovascular: S1 S2 normal no gallop . Respiratory: Coarse breath sounds with few rhonchi . Abdomen: soft . Skin: no rash seen on limited exam . Musculoskeletal: not rigid . Psychiatric:unable to assess . Neurologic: no seizure no involuntary movements         Lab Data:   Basic Metabolic Panel: Recent Labs  Lab 03/07/18 0742  NA 142  K 4.2  CL 102  CO2 36*  GLUCOSE 223*  BUN 37*  CREATININE 0.62  CALCIUM 9.0    ABG: Recent Labs  Lab 03/05/18 0503 03/05/18 0835 03/05/18 1604 03/07/18 1707 03/08/18 1110  PHART 7.314* 7.381 7.361 7.378 7.385  PCO2ART 94.0* 79.8* 80.1* 68.3* 66.5*  PO2ART 68.7* 59.8* 177* 74.6* 85.0  HCO3 46.4* 46.3* 44.2* 39.3* 38.8*  O2SAT 92.8 91.2 99.1 94.5 96.4    Liver Function Tests: No results for input(s): AST, ALT, ALKPHOS, BILITOT, PROT, ALBUMIN in the last 168 hours. No results for input(s): LIPASE, AMYLASE in the last 168 hours. No results for input(s): AMMONIA in the last 168 hours.  CBC: Recent Labs  Lab 03/07/18 0742  WBC 17.6*  HGB 11.8*  HCT 36.4*  MCV 103.4*  PLT 82*    Cardiac Enzymes: No results for  input(s): CKTOTAL, CKMB, CKMBINDEX, TROPONINI in the last 168 hours.  BNP (last 3 results) No results for input(s): BNP in the last 8760 hours.  ProBNP (last 3 results) No results for input(s): PROBNP in the last 8760 hours.  Radiological Exams: No results found.  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 Acute on chronic respiratory failure with hypoxia we will continue with oxygen therapy patient will use the BiPAP at nighttime. 2. COPD severe continue with supportive care and management 3. Lobar pneumonia treated 4. Bilateral effusions we will monitor radiologically 5. Recurrent squamous cell carcinoma of the lung is stable right now continue with supportive care   I have personally seen and evaluated the patient, evaluated laboratory and imaging results, formulated the assessment and plan and placed orders. The Patient requires high complexity decision making for assessment and support.  Case was discussed on Rounds with the Respiratory Therapy Staff  Allyne Gee, MD [facility] Pulmonary Critical Care Medicine Sleep Medicine

## 2018-03-12 ENCOUNTER — Other Ambulatory Visit (HOSPITAL_COMMUNITY): Payer: Medicare Other

## 2018-03-12 DIAGNOSIS — J9620 Acute and chronic respiratory failure, unspecified whether with hypoxia or hypercapnia: Secondary | ICD-10-CM | POA: Diagnosis not present

## 2018-03-12 NOTE — Progress Notes (Signed)
Pulmonary Critical Care Medicine [place]   PULMONARY CRITICAL CARE SERVICE  PROGRESS NOTE  Date of Service: 03/12/2018  Tommy [name]  TJQ:[id number]  DOB: November 25, 1940   DOA: 03/03/2018  Referring Physician: Merton Border, MD  HPI: Tommy [name] is a 78 y.o. male seen for follow up of Acute on Chronic Respiratory Failure.  At this time patient is on 8 L Oxymizer has been more awake.  Patient's daughter was present in the room she was updated.  They are going to try to get his dentures which will help hopefully with getting a better seal on the weaning.  Medications: Reviewed on Rounds  Physical Exam:  Vitals: Temperature 96.9 pulse 94 respiratory rate 26 blood pressure 144/96 saturations 95%  Ventilator Settings off the ventilator on Oxymizer  . General: Comfortable at this time . Eyes: Grossly normal lids, irises & conjunctiva . ENT: grossly tongue is normal . Neck: no obvious mass . Cardiovascular: S1 S2 normal no gallop . Respiratory: No rhonchi or rales are noted at this time . Abdomen: soft . Skin: no rash seen on limited exam . Musculoskeletal: not rigid . Psychiatric:unable to assess . Neurologic: no seizure no involuntary movements         Lab Data:   Basic Metabolic Panel: Recent Labs  Lab 03/07/18 0742  NA 142  K 4.2  CL 102  CO2 36*  GLUCOSE 223*  BUN 37*  CREATININE 0.62  CALCIUM 9.0    ABG: Recent Labs  Lab 03/05/18 1604 03/07/18 1707 03/08/18 1110  PHART 7.361 7.378 7.385  PCO2ART 80.1* 68.3* 66.5*  PO2ART 177* 74.6* 85.0  HCO3 44.2* 39.3* 38.8*  O2SAT 99.1 94.5 96.4    Liver Function Tests: No results for input(s): AST, ALT, ALKPHOS, BILITOT, PROT, ALBUMIN in the last 168 hours. No results for input(s): LIPASE, AMYLASE in the last 168 hours. No results for input(s): AMMONIA in the last 168 hours.  CBC: Recent Labs  Lab 03/07/18 0742  WBC 17.6*  HGB 11.8*  HCT 36.4*  MCV 103.4*  PLT 82*    Cardiac  Enzymes: No results for input(s): CKTOTAL, CKMB, CKMBINDEX, TROPONINI in the last 168 hours.  BNP (last 3 results) No results for input(s): BNP in the last 8760 hours.  ProBNP (last 3 results) No results for input(s): PROBNP in the last 8760 hours.  Radiological Exams: No results found.  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 Acute on chronic respiratory failure with hypoxia we will continue with Oxymizer titrate oxygen down as tolerated maintain saturations greater than 88% 2. COPD severe disease continue with present management 3. Lobar pneumonia treated improving 4. Bilateral effusions follow radiologically 5. Current squamous cell carcinoma of the lung we will continue with supportive care   I have personally seen and evaluated the patient, evaluated laboratory and imaging results, formulated the assessment and plan and placed orders. The Patient requires high complexity decision making for assessment and support.  Case was discussed on Rounds with the Respiratory Therapy Staff  Allyne Gee, MD [facility] Pulmonary Critical Care Medicine Sleep Medicine

## 2018-03-13 NOTE — Progress Notes (Signed)
Pulmonary Critical Care Medicine [place]   PULMONARY CRITICAL CARE SERVICE  PROGRESS NOTE  Date of Service: 03/13/2018  Tommy [name]  [id number]  DOB: Jan 13, 1941   DOA: 03/03/2018  Referring Physician: Merton Border, MD  HPI: Tommy [name] is a 78 y.o. male seen for follow up of Acute on Chronic Respiratory Failure.  Patient remains on nasal cannula he is on BiPAP at nighttime comfortable right now  Medications: Reviewed on Rounds  Physical Exam:  Vitals: Temperature 96.4 pulse 98 respiratory rate 24 blood pressure 147/95 saturations 96%  Ventilator Settings currently off the ventilator  . General: Comfortable at this time . Eyes: Grossly normal lids, irises & conjunctiva . ENT: grossly tongue is normal . Neck: no obvious mass . Cardiovascular: S1 S2 normal no gallop . Respiratory: No rhonchi no rales are noted at this time . Abdomen: soft . Skin: no rash seen on limited exam . Musculoskeletal: not rigid . Psychiatric:unable to assess . Neurologic: no seizure no involuntary movements         Lab Data:   Basic Metabolic Panel: Recent Labs  Lab 03/07/18 0742  NA 142  K 4.2  CL 102  CO2 36*  GLUCOSE 223*  BUN 37*  CREATININE 0.62  CALCIUM 9.0    ABG: Recent Labs  Lab 03/07/18 1707 03/08/18 1110  PHART 7.378 7.385  PCO2ART 68.3* 66.5*  PO2ART 74.6* 85.0  HCO3 39.3* 38.8*  O2SAT 94.5 96.4    Liver Function Tests: No results for input(s): AST, ALT, ALKPHOS, BILITOT, PROT, ALBUMIN in the last 168 hours. No results for input(s): LIPASE, AMYLASE in the last 168 hours. No results for input(s): AMMONIA in the last 168 hours.  CBC: Recent Labs  Lab 03/07/18 0742  WBC 17.6*  HGB 11.8*  HCT 36.4*  MCV 103.4*  PLT 82*    Cardiac Enzymes: No results for input(s): CKTOTAL, CKMB, CKMBINDEX, TROPONINI in the last 168 hours.  BNP (last 3 results) No results for input(s): BNP in the last 8760 hours.  ProBNP (last 3 results) No  results for input(s): PROBNP in the last 8760 hours.  Radiological Exams: Dg Chest Port 1 View  Result Date: 03/12/2018 CLINICAL DATA:  Acute on chronic respiratory failure. EXAM: PORTABLE CHEST 1 VIEW COMPARISON:  CT scan 03/05/2018. Chest x-ray 03/05/2018 FINDINGS: Endotracheal tube has been removed in the interval. Right Port-A-Cath remains in place with tip overlying the distal SVC level. The cardio pericardial silhouette is enlarged. The left base collapse/consolidation is similar with mild improvement in aeration in the right mid and lower lung. Large hiatal hernia again noted. Bones are diffusely demineralized. Telemetry leads overlie the chest. IMPRESSION: 1. Interval extubation. 2. Cardiomegaly with persistent left base collapse/consolidation. 3. Interval improvement in aeration in the right mid and lower lung. 4. Large hiatal hernia. Electronically Signed   By: Misty Stanley M.D.   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 Acute on chronic respiratory failure with hypoxia we will continue with nasal cannula titrate oxygen down as tolerated 2. [name]" overload and recurrent disease we will continue with supportive care 3. Lobar pneumonia treated we will continue to monitor. 4. Bilateral effusions follow-up x-rays as necessary chest x-ray showed some cardiomegaly with some consolidation with  improvement in the aeration of the lungs will follow   I have personally seen and evaluated the patient, evaluated laboratory and imaging results, formulated the assessment and plan and placed orders. The Patient requires high complexity decision making for assessment and support.  Case was discussed on Rounds with the Respiratory Therapy Staff  Allyne Gee, MD [facility] Pulmonary  Critical Care Medicine Sleep Medicine

## 2018-03-14 NOTE — Progress Notes (Signed)
Pulmonary Critical Care Medicine [place]   PULMONARY CRITICAL CARE SERVICE  PROGRESS NOTE  Date of Service: 03/14/2018  Tommy [name]  YBO:[id number]  DOB: [birth year]   DOA: 03/03/2018  Referring Physician: Merton Border, MD  HPI: Tommy [name] is a 78 y.o. male seen for follow up of Acute on Chronic Respiratory Failure.  Patient is back on BiPAP 14/6 currently is requiring 35% FiO2.  Medications: Reviewed on Rounds  Physical Exam:  Vitals: Temperature 97.4 pulse 83 respiratory 22 blood pressure 145/81 saturations 98%  Ventilator Settings on BiPAP by mask IPAP 14 EPAP 16 FiO2 35%  . General: Comfortable at this time . Eyes: Grossly normal lids, irises & conjunctiva . ENT: grossly tongue is normal . Neck: no obvious mass . Cardiovascular: S1 S2 normal no gallop . Respiratory: Coarse breath sounds with few rhonchi are noted . Abdomen: soft . Skin: no rash seen on limited exam . Musculoskeletal: not rigid . Psychiatric:unable to assess . Neurologic: no seizure no involuntary movements         Lab Data:   Basic Metabolic Panel: No results for input(s): NA, K, CL, CO2, GLUCOSE, BUN, CREATININE, CALCIUM, MG, PHOS in the last 168 hours.  ABG: Recent Labs  Lab 03/07/18 1707 03/08/18 1110  PHART 7.378 7.385  PCO2ART 68.3* 66.5*  PO2ART 74.6* 85.0  HCO3 39.3* 38.8*  O2SAT 94.5 96.4    Liver Function Tests: No results for input(s): AST, ALT, ALKPHOS, BILITOT, PROT, ALBUMIN in the last 168 hours. No results for input(s): LIPASE, AMYLASE in the last 168 hours. No results for input(s): AMMONIA in the last 168 hours.  CBC: No results for input(s): WBC, NEUTROABS, HGB, HCT, MCV, PLT in the last 168 hours.  Cardiac Enzymes: No results for input(s): CKTOTAL, CKMB, CKMBINDEX, TROPONINI in the last 168 hours.  BNP (last 3 results) No results for input(s): BNP in the last 8760 hours.  ProBNP (last 3 results) No results for input(s): PROBNP in the last  8760 hours.  Radiological Exams: Dg Chest Port 1 View  Result Date: 03/12/2018 CLINICAL DATA:  Acute on chronic respiratory failure. EXAM: PORTABLE CHEST 1 VIEW COMPARISON:  CT scan 03/05/2018. Chest x-ray 03/05/2018 FINDINGS: Endotracheal tube has been removed in the interval. Right Port-A-Cath remains in place with tip overlying the distal SVC level. The cardio pericardial silhouette is enlarged. The left base collapse/consolidation is similar with mild improvement in aeration in the right mid and lower lung. Large hiatal hernia again noted. Bones are diffusely demineralized. Telemetry leads overlie the chest. IMPRESSION: 1. Interval extubation. 2. Cardiomegaly with persistent left base collapse/consolidation. 3. Interval improvement in aeration in the right mid and lower lung. 4. Large hiatal hernia. Electronically Signed   By: Misty Stanley M.D.   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 Acute on chronic respiratory failure with hypoxia patient requiring BiPAP again we will continue.  Titrate oxygen down as tolerated continue with aggressive pulmonary toilet. 2. Right squamous cell carcinoma treated we will continue to monitor prognosis poor 3. COPD severe disease 4. Lobar pneumonia treated last chest x-ray results as above 5. Bilateral effusions as above continue with supportive care nurse 6. Improvement in aeration   I have personally seen and evaluated the  patient, evaluated laboratory and imaging results, formulated the assessment and plan and placed orders. The Patient requires high complexity decision making for assessment and support.  Case was discussed on Rounds with the Respiratory Therapy Staff  Allyne Gee, MD [facility] Pulmonary Critical Care  Medicine Sleep Medicine

## 2018-03-15 NOTE — Progress Notes (Signed)
Pulmonary Critical Care Medicine [place]   PULMONARY CRITICAL CARE SERVICE  PROGRESS NOTE  Date of Service: 03/15/2018  Tommy [name]  FYT:[id number]  DOB: [birth year]   DOA: 03/03/2018  Referring Physician: Merton Border, MD  HPI: Tommy [name] is a 78 y.o. male seen for follow up of Acute on Chronic Respiratory Failure.  Patient currently is on 6 L Oxymizer which has shown some improvement he was off BiPAP this morning  Medications: Reviewed on Rounds  Physical Exam:  Vitals: Temperature 97.7 pulse 98 respiratory 32 blood pressure is 132/73 saturations 96%  Ventilator Settings currently off the ventilator  . General: Comfortable at this time . Eyes: Grossly normal lids, irises & conjunctiva . ENT: grossly tongue is normal . Neck: no obvious mass . Cardiovascular: S1 S2 normal no gallop . Respiratory: No rhonchi or rales are noted at this time . Abdomen: soft . Skin: no rash seen on limited exam . Musculoskeletal: not rigid . Psychiatric:unable to assess . Neurologic: no seizure no involuntary movements         Lab Data:   Basic Metabolic Panel: No results for input(s): NA, K, CL, CO2, GLUCOSE, BUN, CREATININE, CALCIUM, MG, PHOS in the last 168 hours.  ABG: Recent Labs  Lab 03/08/18 1110  PHART 7.385  PCO2ART 66.5*  PO2ART 85.0  HCO3 38.8*  O2SAT 96.4    Liver Function Tests: No results for input(s): AST, ALT, ALKPHOS, BILITOT, PROT, ALBUMIN in the last 168 hours. No results for input(s): LIPASE, AMYLASE in the last 168 hours. No results for input(s): AMMONIA in the last 168 hours.  CBC: No results for input(s): WBC, NEUTROABS, HGB, HCT, MCV, PLT in the last 168 hours.  Cardiac Enzymes: No results for input(s): CKTOTAL, CKMB, CKMBINDEX, TROPONINI in the last 168 hours.  BNP (last 3 results) No results for input(s): BNP in the last 8760 hours.  ProBNP (last 3 results) No results for input(s): PROBNP in the last 8760  hours.  Radiological Exams: No results found.  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 Acute on chronic respiratory failure hypoxia we will continue with Oxymizer 6 L wean as tolerated 2. Recurrent squamous cell carcinoma 3. Patient is at baseline 4. Severe COPD continue with supportive care 5. Lobar pneumonia treated we will continue to monitor 6. Bilateral effusions we will follow radiologically   I have personally seen and evaluated the patient, evaluated laboratory and imaging results, formulated the assessment and plan and placed orders. The Patient requires high complexity decision making for assessment and support.  Case was discussed on Rounds with the Respiratory Therapy Staff  Allyne Gee, MD [facility] Pulmonary Critical Care Medicine Sleep Medicine

## 2018-03-16 ENCOUNTER — Ambulatory Visit (HOSPITAL_COMMUNITY): Payer: PRIVATE HEALTH INSURANCE | Attending: Internal Medicine

## 2018-03-16 ENCOUNTER — Other Ambulatory Visit (HOSPITAL_COMMUNITY): Payer: Medicare Other

## 2018-03-16 DIAGNOSIS — M7989 Other specified soft tissue disorders: Secondary | ICD-10-CM

## 2018-03-16 DIAGNOSIS — J90 Pleural effusion, not elsewhere classified: Secondary | ICD-10-CM | POA: Diagnosis not present

## 2018-03-16 DIAGNOSIS — M79602 Pain in left arm: Secondary | ICD-10-CM

## 2018-03-16 DIAGNOSIS — J189 Pneumonia, unspecified organism: Secondary | ICD-10-CM | POA: Diagnosis not present

## 2018-03-16 NOTE — Progress Notes (Signed)
Pulmonary Critical Care Medicine [place]   PULMONARY CRITICAL CARE SERVICE  PROGRESS NOTE  Date of Service: 03/16/2018  Tommy [name]  AFB:[id number]  DOB: Nov 07, 1940   DOA: 03/03/2018  Referring Physician: Merton Border, MD  HPI: Tommy [name] is a 78 y.o. male seen for follow up of Acute on Chronic Respiratory Failure.  This morning patient was more confused and was placed back on BiPAP.  Apparently overnight he had received Ativan.  His left lower lobe also was looking more atelectatic.  Spoke with the daughter who was present in the room and indicated to her that he may possibly need to be intubated once again as his condition was slowly deteriorating.  Medications: Reviewed on Rounds  Physical Exam:  Vitals: Temperature 96.9 pulse 118 respiratory 26 blood pressure 136/41 saturations 96%  Ventilator Settings on BiPAP 14/6 FiO2 35% tidal volume 486  . General: Comfortable at this time . Eyes: Grossly normal lids, irises & conjunctiva . ENT: grossly tongue is normal . Neck: no obvious mass . Cardiovascular: S1 S2 normal no gallop . Respiratory: No rhonchi or rales are noted at this time diminished left base . Abdomen: soft . Skin: no rash seen on limited exam . Musculoskeletal: not rigid . Psychiatric:unable to assess . Neurologic: no seizure no involuntary movements         Lab Data:   Basic Metabolic Panel: No results for input(s): NA, K, CL, CO2, GLUCOSE, BUN, CREATININE, CALCIUM, MG, PHOS in the last 168 hours.  ABG: No results for input(s): PHART, PCO2ART, PO2ART, HCO3, O2SAT in the last 168 hours.  Liver Function Tests: No results for input(s): AST, ALT, ALKPHOS, BILITOT, PROT, ALBUMIN in the last 168 hours. No results for input(s): LIPASE, AMYLASE in the last 168 hours. No results for input(s): AMMONIA in the last 168 hours.  CBC: No results for input(s): WBC, NEUTROABS, HGB, HCT, MCV, PLT in the last 168 hours.  Cardiac Enzymes: No  results for input(s): CKTOTAL, CKMB, CKMBINDEX, TROPONINI in the last 168 hours.  BNP (last 3 results) No results for input(s): BNP in the last 8760 hours.  ProBNP (last 3 results) No results for input(s): PROBNP in the last 8760 hours.  Radiological Exams: No results found.  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 Acute on chronic respiratory failure with hypoxia patient's condition is waxing and waning current downturn is likely secondary to the Ativan we will continue to monitor. 2. COPD severe disease prognosis guarded 3. Recurrent squamous cell carcinoma again overall prognosis is guarded 4. Lobar pneumonia treated he has probably got some mucous plugging on the left side we will continue with aggressive chest PT he has also been on Mucomyst has not seemed to help 5. Bilateral effusions we will continue with supportive care I have asked for a follow-up CT to be done in case the findings on the left are actually pleural effusion related   I have personally seen and evaluated the patient, evaluated laboratory and imaging results, formulated the assessment and plan and placed orders. The Patient requires high complexity decision making for assessment and support.  Case was discussed on Rounds with the Respiratory Therapy Staff  Allyne Gee, MD [facility] Pulmonary Critical Care Medicine Sleep Medicine

## 2018-03-16 NOTE — Progress Notes (Signed)
Right upper extremity venous duplex exam completed. Positive Acute deep vein thrombosis involving right Internal Jugular vein. Result notified Dr. Laren Everts at bedside. More details please see preliminary notes on CV PROC under chart review.  Elisabella Hacker H Dorean Daniello(RDMS RVT) 03/16/18 4:38 PM

## 2018-03-17 ENCOUNTER — Encounter: Payer: Self-pay | Admitting: Internal Medicine

## 2018-03-17 DIAGNOSIS — I7101 Dissection of ascending aorta: Secondary | ICD-10-CM | POA: Diagnosis present

## 2018-03-17 DIAGNOSIS — I82621 Acute embolism and thrombosis of deep veins of right upper extremity: Secondary | ICD-10-CM

## 2018-03-17 DIAGNOSIS — I82629 Acute embolism and thrombosis of deep veins of unspecified upper extremity: Secondary | ICD-10-CM | POA: Diagnosis present

## 2018-03-17 LAB — HEPARIN LEVEL (UNFRACTIONATED)
Heparin Unfractionated: 0.31 IU/mL (ref 0.30–0.70)
Heparin Unfractionated: 0.6 IU/mL (ref 0.30–0.70)

## 2018-03-17 NOTE — Progress Notes (Addendum)
Pulmonary Critical Care Medicine [place]   PULMONARY CRITICAL CARE SERVICE  PROGRESS NOTE  Date of Service: 03/17/2018  Tommy [name]  FXT:[id number]  DOB: [birth year]   DOA: 03/03/2018  Referring Physician: Merton Border, MD  HPI: Tommy [name] is a 78 y.o. male seen for follow up of Acute on Chronic Respiratory Failure.  Patient has a DVT which was noted in the upper extremity.  Patient also had a CT scan which shows that his aortic aneurysm has actually enlarged since about a month ago.  In addition I reviewed his CT for this pulmonary mass which also seems to be enlarging.  Because of the dissecting aortic aneurysm he was deemed not a candidate for anticoagulation.  The patient's high risk for rupture as it is.  I spoke to the patient's wife at length explained to her that his overall prognosis is quite poor.  We have recommended that they consider DNR he would not tolerate chest compressions in the event of cardiac arrest.  I also further did advise them that it may be in the best interest of the patient to not intubate him as well as the CPR being withheld because of findings on the CT scan.  Medications: Reviewed on Rounds  Physical Exam:  Vitals: Temperature 96.7 pulse 96 respiratory rate 15 blood pressure 114/75 saturations 99%  Ventilator Settings currently off the ventilator on Oxymizer  . General: Comfortable at this time . Eyes: Grossly normal lids, irises & conjunctiva . ENT: grossly tongue is normal . Neck: no obvious mass . Cardiovascular: S1 S2 normal no gallop . Respiratory: No rhonchi or rales are noted at this time . Abdomen: soft . Skin: no rash seen on limited exam . Musculoskeletal: not rigid . Psychiatric:unable to assess . Neurologic: no seizure no involuntary movements         Lab Data:   Basic Metabolic Panel: No results for input(s): NA, K, CL, CO2, GLUCOSE, BUN, CREATININE, CALCIUM, MG, PHOS in the last 168 hours.  ABG: No  results for input(s): PHART, PCO2ART, PO2ART, HCO3, O2SAT in the last 168 hours.  Liver Function Tests: No results for input(s): AST, ALT, ALKPHOS, BILITOT, PROT, ALBUMIN in the last 168 hours. No results for input(s): LIPASE, AMYLASE in the last 168 hours. No results for input(s): AMMONIA in the last 168 hours.  CBC: No results for input(s): WBC, NEUTROABS, HGB, HCT, MCV, PLT in the last 168 hours.  Cardiac Enzymes: No results for input(s): CKTOTAL, CKMB, CKMBINDEX, TROPONINI in the last 168 hours.  BNP (last 3 results) No results for input(s): BNP in the last 8760 hours.  ProBNP (last 3 results) No results for input(s): PROBNP in the last 8760 hours.  Radiological Exams: Ct Chest Wo Contrast  Result Date: 03/16/2018 CLINICAL DATA:  Pneumonia. EXAM: CT CHEST WITHOUT CONTRAST TECHNIQUE: Multidetector CT imaging of the chest was performed following the standard protocol without IV contrast. COMPARISON:  Radiograph of March 12, 2018. CT scan of March 05, 2018. FINDINGS: Cardiovascular: Atherosclerosis of thoracic aorta is noted. Dissection is again noted in ascending thoracic aorta. 5.9 cm aneurysmal dilatation is noted of ascending thoracic aorta which is slightly enlarged compared to prior exam. Descending thoracic aorta is normal in caliber. Normal cardiac size. No pericardial effusion is noted. Coronary artery calcifications are noted. Status post aortic valve replacement. Mediastinum/Nodes: Thyroid gland appears normal. No adenopathy is noted. Stable large hiatal hernia is noted. Lungs/Pleura: No pneumothorax is noted. Mild left pleural effusion is noted  with adjacent left lower lobe atelectasis or pneumonia. This is increased compared to prior exam. Stable fibrosis is seen in the wall in the right upper lobe and right hilar region consistent with prior radiation treatment. Stable mild right basilar subsegmental atelectasis is noted posteriorly. Upper Abdomen: No acute abnormality.  Musculoskeletal: No chest wall mass or suspicious bone lesions identified. IMPRESSION: Dissection of ascending thoracic aorta is again noted as described on prior exam, although not well evaluated due to the lack of intravenous contrast. Aneurysmal dilatation of ascending thoracic aorta is noted which now measures 5.9 cm which is slightly enlarged compared to prior exam. Mild left pleural effusion is noted with adjacent left lower lobe atelectasis or pneumonia, which is slightly increased compared to prior exam. 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 Coronary artery calcifications are noted suggesting coronary artery disease. Aortic Atherosclerosis (ICD10-I70.0). Electronically Signed   By: Marijo Conception, M.D.   On: 03/16/2018 21:28   Vas [place] Upper Extremity Venous Duplex  Result Date: 03/16/2018 UPPER VENOUS STUDY  Indications: Pain, and Swelling Limitations: Body habitus and immobility. Comparison Study: No comparison study available Performing Technologist: Rudell Cobb  Examination Guidelines: A complete evaluation includes B-mode imaging, spectral Doppler, color Doppler, and power Doppler as needed of all accessible portions of each vessel. Bilateral testing is considered an integral part of a complete examination. Limited examinations for reoccurring indications may be performed as noted.  Right Findings: +----------+------------+----------+---------+-----------+-------+ RIGHT     CompressiblePropertiesPhasicitySpontaneousSummary +----------+------------+----------+---------+-----------+-------+ Subclavian    Full                 Yes       Yes            +----------+------------+----------+---------+-----------+-------+  Left Findings: +----------+------------+----------+---------+-----------+-------+ LEFT      CompressiblePropertiesPhasicitySpontaneousSummary +----------+------------+----------+---------+-----------+-------+ IJV            None                                   Acute  +----------+------------+----------+---------+-----------+-------+ Subclavian    Full                 Yes       Yes            +----------+------------+----------+---------+-----------+-------+ Axillary      Full                           Yes            +----------+------------+----------+---------+-----------+-------+ Brachial      Full                           Yes            +----------+------------+----------+---------+-----------+-------+ Radial        Full                                          +----------+------------+----------+---------+-----------+-------+ Ulnar         Full                                          +----------+------------+----------+---------+-----------+-------+  Cephalic      Full                                          +----------+------------+----------+---------+-----------+-------+ Basilic       Full                                          +----------+------------+----------+---------+-----------+-------+  Summary:  Right: No evidence of thrombosis in the subclavian.  Left: Findings consistent with acute deep vein thrombosis involving the left internal jugular veins.  *See table(s) above for measurements and observations.  Diagnosing physician: Curt Jews MD Electronically signed by Curt Jews MD on 03/16/2018 at 8:03:30 PM.    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 Acute on chronic respiratory failure with hypoxia patient will continue with the oxygen therapy as necessary.  Right now is off BiPAP 2. COPD severe disease patient will be continue with present  management 3. Recurring squamous cell carcinoma of the lung by CT scan it appears that the tumor may be enlarging we will continue with supportive care prognosis poor 4. Lobar pneumonia treated 5. Bilateral effusions treated. 6. Aortic aneurysm dissecting on the CT patient's prognosis is poor 7. DVT right upper extremity not a candidate for anticoagulation secondary to the aortic aneurysm   I have personally seen and evaluated the patient, evaluated laboratory and imaging results, formulated the assessment and plan and placed orders.  Time 35 minutes extensive discussion with the patient and the wife during rounds The Patient requires high complexity decision making for assessment and support.  Case was discussed on Rounds with the Respiratory Therapy Staff  Allyne Gee, MD [facility] Pulmonary Critical Care Medicine Sleep Medicine

## 2018-03-18 LAB — CBC
HEMATOCRIT: 29.8 % — AB (ref 39.0–52.0)
Hemoglobin: 9.3 g/dL — ABNORMAL LOW (ref 13.0–17.0)
MCH: 32 pg (ref 26.0–34.0)
MCHC: 31.2 g/dL (ref 30.0–36.0)
MCV: 102.4 fL — ABNORMAL HIGH (ref 80.0–100.0)
Platelets: 67 10*3/uL — ABNORMAL LOW (ref 150–400)
RBC: 2.91 MIL/uL — ABNORMAL LOW (ref 4.22–5.81)
RDW: 16.7 % — AB (ref 11.5–15.5)
WBC: 11.1 10*3/uL — AB (ref 4.0–10.5)
nRBC: 0 % (ref 0.0–0.2)

## 2018-03-18 NOTE — Progress Notes (Signed)
Pulmonary Critical Care Medicine [place]   PULMONARY CRITICAL CARE SERVICE  PROGRESS NOTE  Date of Service: 03/18/2018  Tommy [name]  GXQ:[id number]  DOB: [birth year]   DOA: 03/03/2018  Referring Physician: Merton Border, MD  HPI: Tommy [name] is a 78 y.o. male seen for follow up of Acute on Chronic Respiratory Failure.  Patient is at baseline the family has made him DNR at they want to try to keep him comfortable at this point.  Patient is going to continue with oxygen right now is down to 5 L Oxymizer  Medications: Reviewed on Rounds  Physical Exam:  Vitals: Temperature 97.0 pulse 85 respiratory rate 16 blood pressure 156/86 saturations 100%  Ventilator Settings currently off the ventilator on Oxymizer  . General: Comfortable at this time . Eyes: Grossly normal lids, irises & conjunctiva . ENT: grossly tongue is normal . Neck: no obvious mass . Cardiovascular: S1 S2 normal no gallop . Respiratory: No rhonchi or rales are noted at this time . Abdomen: soft . Skin: no rash seen on limited exam . Musculoskeletal: not rigid . Psychiatric:unable to assess . Neurologic: no seizure no involuntary movements         Lab Data:   Basic Metabolic Panel: No results for input(s): NA, K, CL, CO2, GLUCOSE, BUN, CREATININE, CALCIUM, MG, PHOS in the last 168 hours.  ABG: No results for input(s): PHART, PCO2ART, PO2ART, HCO3, O2SAT in the last 168 hours.  Liver Function Tests: No results for input(s): AST, ALT, ALKPHOS, BILITOT, PROT, ALBUMIN in the last 168 hours. No results for input(s): LIPASE, AMYLASE in the last 168 hours. No results for input(s): AMMONIA in the last 168 hours.  CBC: Recent Labs  Lab 03/18/18 0514  WBC 11.1*  HGB 9.3*  HCT 29.8*  MCV 102.4*  PLT 67*    Cardiac Enzymes: No results for input(s): CKTOTAL, CKMB, CKMBINDEX, TROPONINI in the last 168 hours.  BNP (last 3 results) No results for input(s): BNP in the last 8760  hours.  ProBNP (last 3 results) No results for input(s): PROBNP in the last 8760 hours.  Radiological Exams: Ct Chest Wo Contrast  Result Date: 03/16/2018 CLINICAL DATA:  Pneumonia. EXAM: CT CHEST WITHOUT CONTRAST TECHNIQUE: Multidetector CT imaging of the chest was performed following the standard protocol without IV contrast. COMPARISON:  Radiograph of March 12, 2018. CT scan of March 05, 2018. FINDINGS: Cardiovascular: Atherosclerosis of thoracic aorta is noted. Dissection is again noted in ascending thoracic aorta. 5.9 cm aneurysmal dilatation is noted of ascending thoracic aorta which is slightly enlarged compared to prior exam. Descending thoracic aorta is normal in caliber. Normal cardiac size. No pericardial effusion is noted. Coronary artery calcifications are noted. Status post aortic valve replacement. Mediastinum/Nodes: Thyroid gland appears normal. No adenopathy is noted. Stable large hiatal hernia is noted. Lungs/Pleura: No pneumothorax is noted. Mild left pleural effusion is noted with adjacent left lower lobe atelectasis or pneumonia. This is increased compared to prior exam. Stable fibrosis is seen in the wall in the right upper lobe and right hilar region consistent with prior radiation treatment. Stable mild right basilar subsegmental atelectasis is noted posteriorly. Upper Abdomen: No acute abnormality. Musculoskeletal: No chest wall mass or suspicious bone lesions identified. IMPRESSION: Dissection of ascending thoracic aorta is again noted as described on prior exam, although not well evaluated due to the lack of intravenous contrast. Aneurysmal dilatation of ascending thoracic aorta is noted which now measures 5.9 cm which is slightly enlarged compared  to prior exam. Mild left pleural effusion is noted with adjacent left lower lobe atelectasis or pneumonia, which is slightly increased compared to prior exam. Stable radiation fibrosis is noted in right perihilar region and right  upper lobe. Coronary artery calcifications are noted suggesting coronary artery disease. Aortic Atherosclerosis (ICD10-I70.0). Electronically Signed   By: Marijo Conception, M.D.   On: 03/16/2018 21:28   Vas [place] Upper Extremity Venous Duplex  Result Date: 03/16/2018 UPPER VENOUS STUDY  Indications: Pain, and Swelling Limitations: Body habitus and immobility. Comparison Study: No comparison study available Performing Technologist: Rudell Cobb  Examination Guidelines: A complete evaluation includes B-mode imaging, spectral Doppler, color Doppler, and power Doppler as needed of all accessible portions of each vessel. Bilateral testing is considered an integral part of a complete examination. Limited examinations for reoccurring indications may be performed as noted.  Right Findings: +----------+------------+----------+---------+-----------+-------+ RIGHT     CompressiblePropertiesPhasicitySpontaneousSummary +----------+------------+----------+---------+-----------+-------+ Subclavian    Full                 Yes       Yes            +----------+------------+----------+---------+-----------+-------+  Left Findings: +----------+------------+----------+---------+-----------+-------+ LEFT      CompressiblePropertiesPhasicitySpontaneousSummary +----------+------------+----------+---------+-----------+-------+ IJV           None                                   Acute  +----------+------------+----------+---------+-----------+-------+ Subclavian    Full                 Yes       Yes            +----------+------------+----------+---------+-----------+-------+ Axillary      Full                           Yes            +----------+------------+----------+---------+-----------+-------+ Brachial      Full                           Yes            +----------+------------+----------+---------+-----------+-------+ Radial        Full                                           +----------+------------+----------+---------+-----------+-------+ Ulnar         Full                                          +----------+------------+----------+---------+-----------+-------+ Cephalic      Full                                          +----------+------------+----------+---------+-----------+-------+ Basilic       Full                                          +----------+------------+----------+---------+-----------+-------+  Summary:  Right:  No evidence of thrombosis in the subclavian.  Left: Findings consistent with acute deep vein thrombosis involving the left internal jugular veins.  *See table(s) above for measurements and observations.  Diagnosing physician: Curt Jews MD Electronically signed by Curt Jews MD on 03/16/2018 at 8:03:30 PM.    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 Acute on chronic respiratory failure with hypoxia patient will continue with Oxymizer titrate oxygen as tolerated continue secretion management pulmonary toilet 2. Recurrent squamous cell carcinoma poor prognosis tumor appeared to be slightly bigger. 3. Lobar pneumonia treated 4. Bilateral effusions continue to monitor 5. Dissecting aortic aneurysm poor prognosis continue supportive care 6. DVT conservative management not a candidate for anticoagulation 7. COPD severe disease continue with supportive care   I have personally seen and evaluated the patient, evaluated laboratory and imaging results, formulated the assessment and plan and placed orders. The Patient requires high complexity decision making for assessment and support.  Case was discussed on Rounds with the  Respiratory Therapy Staff  Allyne Gee, MD [facility] Pulmonary Critical Care Medicine Sleep Medicine

## 2018-03-19 LAB — CBC
HCT: 30.6 % — ABNORMAL LOW (ref 39.0–52.0)
Hemoglobin: 10.2 g/dL — ABNORMAL LOW (ref 13.0–17.0)
MCH: 34.2 pg — ABNORMAL HIGH (ref 26.0–34.0)
MCHC: 33.3 g/dL (ref 30.0–36.0)
MCV: 102.7 fL — ABNORMAL HIGH (ref 80.0–100.0)
Platelets: 73 10*3/uL — ABNORMAL LOW (ref 150–400)
RBC: 2.98 MIL/uL — ABNORMAL LOW (ref 4.22–5.81)
RDW: 17.2 % — ABNORMAL HIGH (ref 11.5–15.5)
WBC: 10 10*3/uL (ref 4.0–10.5)
nRBC: 0 % (ref 0.0–0.2)

## 2018-03-19 NOTE — Progress Notes (Signed)
Pulmonary Critical Care Medicine [place]   PULMONARY CRITICAL CARE SERVICE  PROGRESS NOTE  Date of Service: 03/19/2018  Tommy [name]  [id number]:[id number]  DOB: 1940/06/13   DOA: 03/03/2018  Referring Physician: Merton Border, MD  HPI: Tommy [name] is a 78 y.o. male seen for follow up of Acute on Chronic Respiratory Failure.  Patient is comfortable at this time without distress.  Remains on Oxymizer being titrated patient is DNR as noted previously  Medications: Reviewed on Rounds  Physical Exam:  Vitals: Temperature 97.0 pulse 97 respiratory 18 blood pressure 140/75 saturations 97%  Ventilator Settings off the ventilator on Oxymizer  . General: Comfortable at this time . Eyes: Grossly normal lids, irises & conjunctiva . ENT: grossly tongue is normal . Neck: no obvious mass . Cardiovascular: S1 S2 normal no gallop . Respiratory: No rhonchi or rales are noted at this time . Abdomen: soft . Skin: no rash seen on limited exam . Musculoskeletal: not rigid . Psychiatric:unable to assess . Neurologic: no seizure no involuntary movements         Lab Data:   Basic Metabolic Panel: No results for input(s): NA, K, CL, CO2, GLUCOSE, BUN, CREATININE, CALCIUM, MG, PHOS in the last 168 hours.  ABG: No results for input(s): PHART, PCO2ART, PO2ART, HCO3, O2SAT in the last 168 hours.  Liver Function Tests: No results for input(s): AST, ALT, ALKPHOS, BILITOT, PROT, ALBUMIN in the last 168 hours. No results for input(s): LIPASE, AMYLASE in the last 168 hours. No results for input(s): AMMONIA in the last 168 hours.  CBC: Recent Labs  Lab 03/18/18 0514  WBC 11.1*  HGB 9.3*  HCT 29.8*  MCV 102.4*  PLT 67*    Cardiac Enzymes: No results for input(s): CKTOTAL, CKMB, CKMBINDEX, TROPONINI in the last 168 hours.  BNP (last 3 results) No results for input(s): BNP in the last 8760 hours.  ProBNP (last 3 results) No results for input(s): PROBNP in the last 8760  hours.  Radiological Exams: No results found.  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 Acute on chronic respiratory failure with hypoxia we will continue with oxygen therapy and titrate as tolerated patient is DNR not to be intubated or resuscitated 2. 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 Lobar pneumonia treated 4. Bilateral effusions monitor radiologically 5. Dissecting aneurysm stable at this time but high risk for rupture 6. DVT not candidate for anticoagulation secondary to above   I have personally seen and evaluated the patient, evaluated laboratory and imaging results, formulated the assessment and plan and placed orders. The Patient requires high complexity decision making for assessment and support.  Case was discussed on Rounds with the Respiratory Therapy Staff  Allyne Gee, MD [facility] Pulmonary Critical Care Medicine Sleep Medicine

## 2018-03-20 NOTE — Progress Notes (Signed)
Pulmonary Critical Care Medicine [place]   PULMONARY CRITICAL CARE SERVICE  PROGRESS NOTE  Date of Service: 03/20/2018  KOA ZOELLER  [id number]  DOB: September 11, 1940   DOA: 03/03/2018  Referring Physician: Merton Border, MD  HPI: Tommy [name] is a 78 y.o. male seen for follow up of Acute on Chronic Respiratory Failure.  Patient right now is on Oxymizer at 5 L he is comfortable  Medications: Reviewed on Rounds  Physical Exam:  Vitals: Temperature 96.6 pulse 92 respiratory rate 24 blood pressure 117/78 saturations 100%  Ventilator Settings off the ventilator on Oxymizer  . General: Comfortable at this time . Eyes: Grossly normal lids, irises & conjunctiva . ENT: grossly tongue is normal . Neck: no obvious mass . Cardiovascular: S1 S2 normal no gallop . Respiratory: No rhonchi or rales are noted . Abdomen: soft . Skin: no rash seen on limited exam . Musculoskeletal: not rigid . Psychiatric:unable to assess . Neurologic: no seizure no involuntary movements         Lab Data:   Basic Metabolic Panel: No results for input(s): NA, K, CL, CO2, GLUCOSE, BUN, CREATININE, CALCIUM, MG, PHOS in the last 168 hours.  ABG: No results for input(s): PHART, PCO2ART, PO2ART, HCO3, O2SAT in the last 168 hours.  Liver Function Tests: No results for input(s): AST, ALT, ALKPHOS, BILITOT, PROT, ALBUMIN in the last 168 hours. No results for input(s): LIPASE, AMYLASE in the last 168 hours. No results for input(s): AMMONIA in the last 168 hours.  CBC: Recent Labs  Lab 03/18/18 0514 03/19/18 0800  WBC 11.1* 10.0  HGB 9.3* 10.2*  HCT 29.8* 30.6*  MCV 102.4* 102.7*  PLT 67* 73*    Cardiac Enzymes: No results for input(s): CKTOTAL, CKMB, CKMBINDEX, TROPONINI in the last 168 hours.  BNP (last 3 results) No results for input(s): BNP in the last 8760 hours.  ProBNP (last 3 results) No results for input(s): PROBNP in the last 8760 hours.  Radiological Exams: No  results found.  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 Acute on chronic respiratory failure with hypoxia we will continue with Oxymizer titrate oxygen as tolerated 2. COPD severe disease we will continue with present management 3. Lobar pneumonia treated we will follow 4. Bilateral pleural effusions at baseline we will continue with supportive care 5. Dissecting aortic aneurysm poor prognosis supportive care 6. We will continue not anticoagulation candidate   I have personally seen and evaluated the patient, evaluated laboratory and imaging results, formulated the assessment and plan and placed orders. The Patient requires high complexity decision making for assessment and support.  Case was discussed on Rounds with the Respiratory Therapy Staff  Allyne Gee, MD [facility] Pulmonary Critical Care Medicine Sleep Medicine

## 2018-03-21 NOTE — Progress Notes (Signed)
Pulmonary Critical Care Medicine [place]   PULMONARY CRITICAL CARE SERVICE  PROGRESS NOTE  Date of Service: 03/21/2018  Tommy [name]  [id number]:[id number]  DOB: 09-12-1940   DOA: 03/03/2018  Referring Physician: Merton Border, MD  HPI: Tommy [name] is a 78 y.o. male seen for follow up of Acute on Chronic Respiratory Failure.  Patient is on Oxymizer currently is on 5 L at baseline.  Patient is going to be likely going home with hospice  Medications: Reviewed on Rounds  Physical Exam:  Vitals: Temperature 96.7 pulse 99 respiratory 18 blood pressure 120/73 saturations 99%  Ventilator Settings off the ventilator at this time  . General: Comfortable at this time . Eyes: Grossly normal lids, irises & conjunctiva . ENT: grossly tongue is normal . Neck: no obvious mass . Cardiovascular: S1 S2 normal no gallop . Respiratory: No rhonchi or rales are noted at this time . Abdomen: soft . Skin: no rash seen on limited exam . Musculoskeletal: not rigid . Psychiatric:unable to assess . Neurologic: no seizure no involuntary movements         Lab Data:   Basic Metabolic Panel: No results for input(s): NA, K, CL, CO2, GLUCOSE, BUN, CREATININE, CALCIUM, MG, PHOS in the last 168 hours.  ABG: No results for input(s): PHART, PCO2ART, PO2ART, HCO3, O2SAT in the last 168 hours.  Liver Function Tests: No results for input(s): AST, ALT, ALKPHOS, BILITOT, PROT, ALBUMIN in the last 168 hours. No results for input(s): LIPASE, AMYLASE in the last 168 hours. No results for input(s): AMMONIA in the last 168 hours.  CBC: Recent Labs  Lab 03/18/18 0514 03/19/18 0800  WBC 11.1* 10.0  HGB 9.3* 10.2*  HCT 29.8* 30.6*  MCV 102.4* 102.7*  PLT 67* 73*    Cardiac Enzymes: No results for input(s): CKTOTAL, CKMB, CKMBINDEX, TROPONINI in the last 168 hours.  BNP (last 3 results) No results for input(s): BNP in the last 8760 hours.  ProBNP (last 3 results) No results for  input(s): PROBNP in the last 8760 hours.  Radiological Exams: No results found.  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 Acute on chronic respiratory failure with hypoxia we will continue with Oxymizer titrate as tolerated continue pulmonary toilet supportive care. 2. Recurrent squamous cell carcinoma of the lung poor prognosis continue supportive care 3. Severe COPD continue present management 4. Lobar pneumonia treated 5. Dissecting aortic aneurysm patient is not a candidate for surgery 6. DVT not a candidate for anticoagulation because of the aneurysm   I have personally seen and evaluated the patient, evaluated laboratory and imaging results, formulated the assessment and plan and placed orders. The Patient requires high complexity decision making for assessment and support.  Case was discussed on Rounds with the Respiratory Therapy Staff  Allyne Gee, MD [facility] Pulmonary Critical Care Medicine Sleep Medicine

## 2018-04-02 DEATH — deceased

## 2019-05-15 IMAGING — DX DG CHEST 1V PORT
1 series · 2 of 2 positions shown · non-contrast
Comparison: Single-view of the chest 02/27/2018 and 10/29/2017. CT
chest 02/12/2018.

CLINICAL DATA: Status post intubation today. History of lung cancer
and aortic dissection.

EXAM:
PORTABLE CHEST 1 VIEW

[Series 1: chest · 0.14mm/px · 2 of 2 slices shown]
[im 1/2]
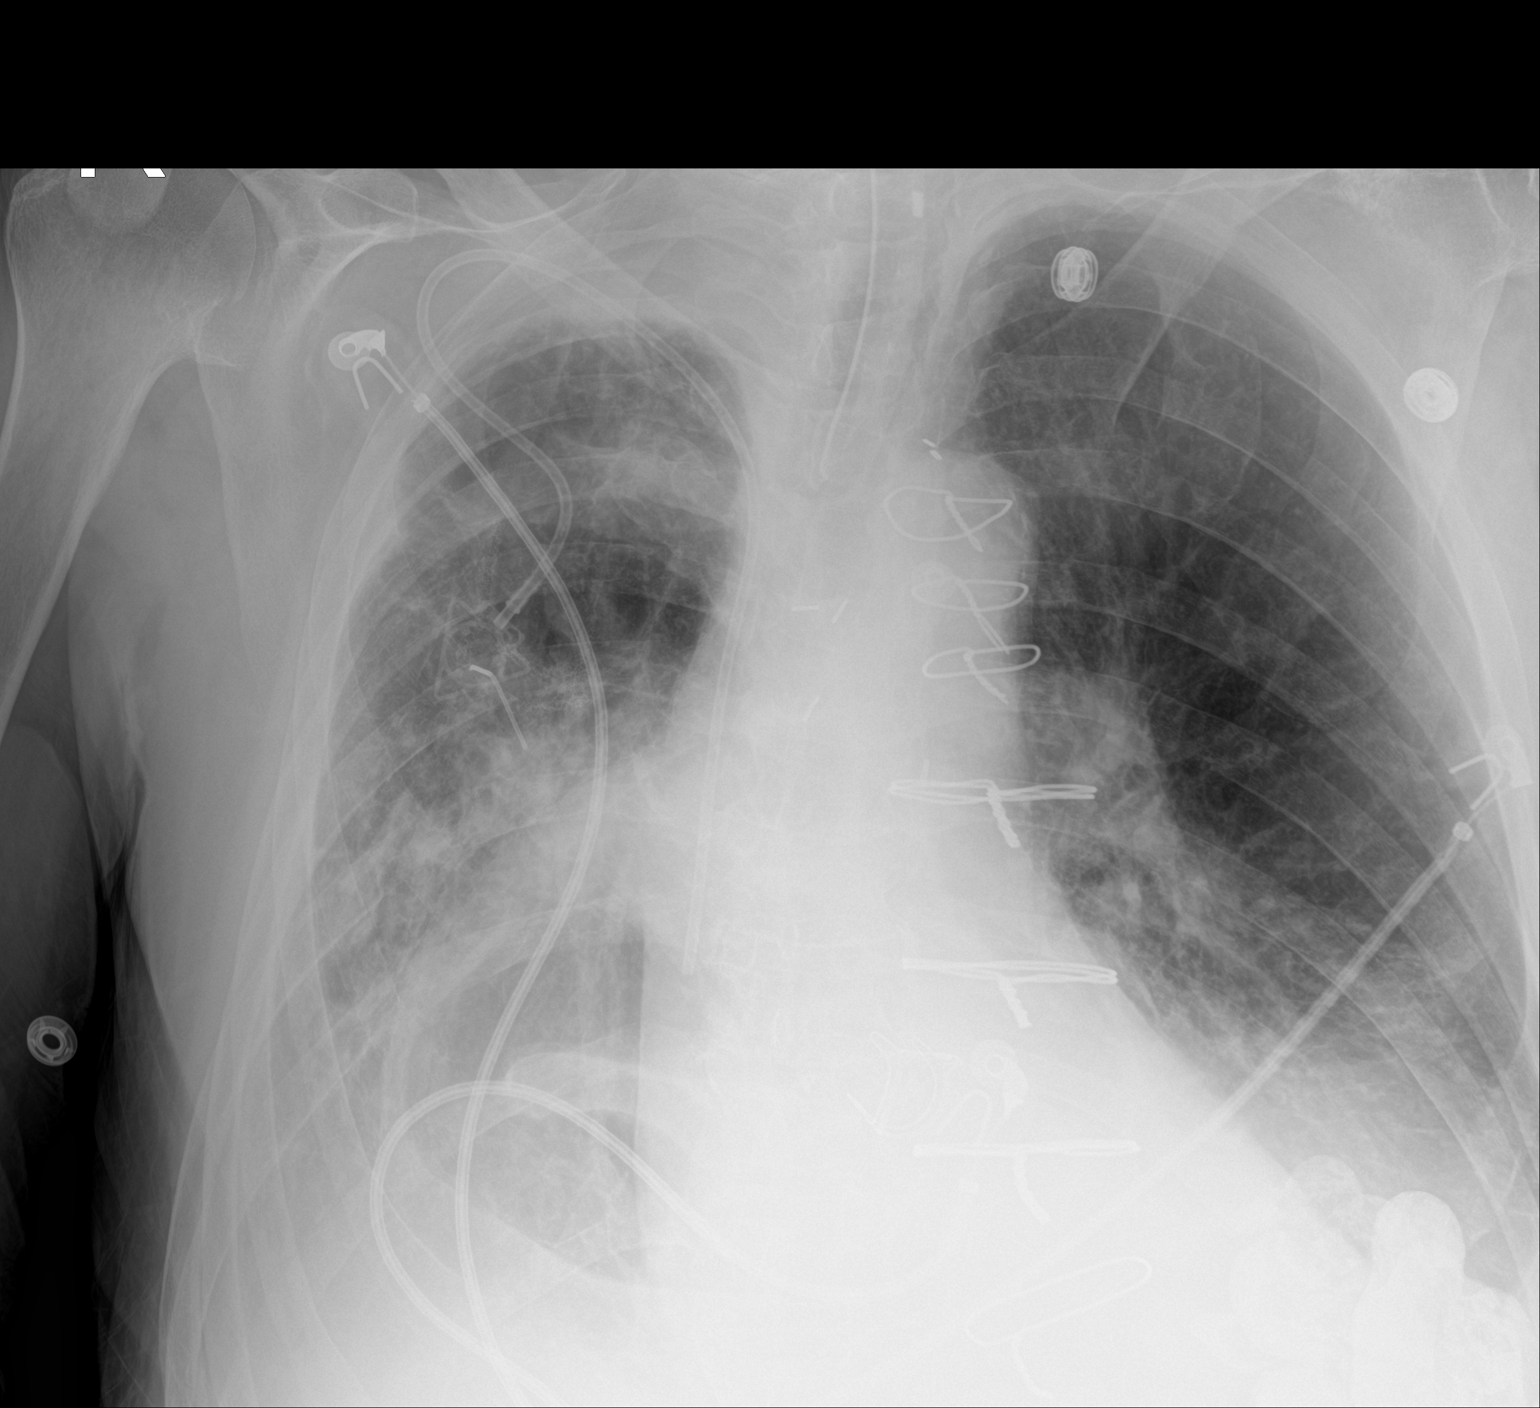
[im 2/2]
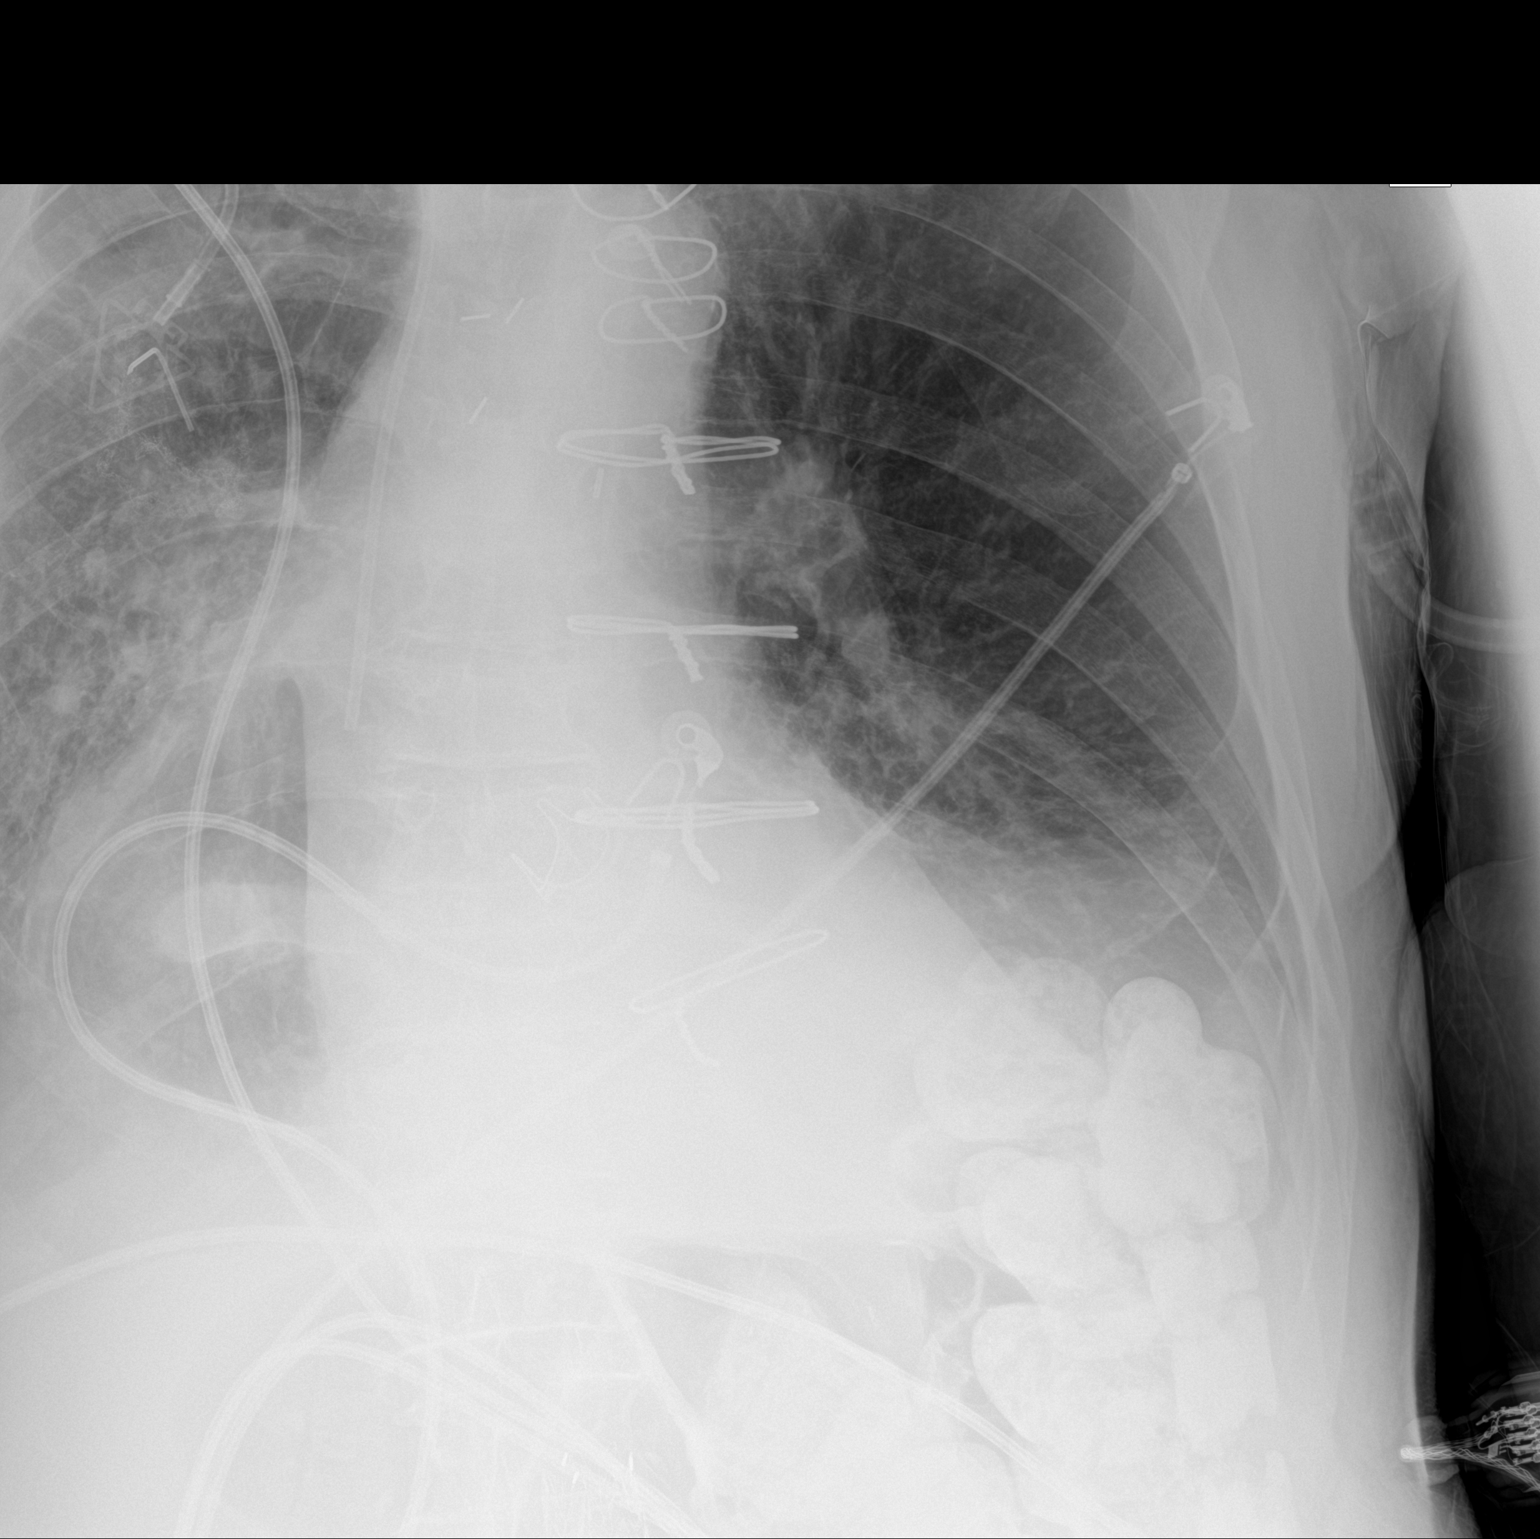

[2 of 2 positions shown; findings below may reference images not displayed]

FINDINGS: Endotracheal tube is in place with the tip in good position at the
clavicular heads. Right subclavian Port-A-Cath is unchanged with its
tip in the lower superior vena cava.

The lungs are emphysematous. There has been marked worsening of
airspace disease in the right mid and lower lung zones. The patient
has a moderate right pleural effusion. Small to moderate left
pleural effusion and basilar airspace disease have also progressed
since the most recent exam. No pneumothorax. Right perihilar opacity
consistent with known lung carcinoma noted.
IMPRESSION: ETT in good position.

Increased right worse than left pleural effusions and airspace
disease which could be atelectasis or pneumonia.

Emphysema.

Right perihilar opacity correlates with history of lung cancer.

## 2019-05-15 IMAGING — CT CT HEAD W/O CM
3 of 4 series · 13 of 47 positions shown, 15 images · non-contrast
Comparison: CT of the head performed 02/02/2018

CLINICAL DATA: Acute onset of altered mental status.

EXAM:
CT HEAD WITHOUT CONTRAST
TECHNIQUE: Contiguous axial images were obtained from the base of the skull
through the vertex without intravenous contrast.

[Series 3: head wo · axial · 0.42mm/px · z∈[-110,+16]mm · 7 of 35 slices shown, 9 images]
[im 5/35  brain]
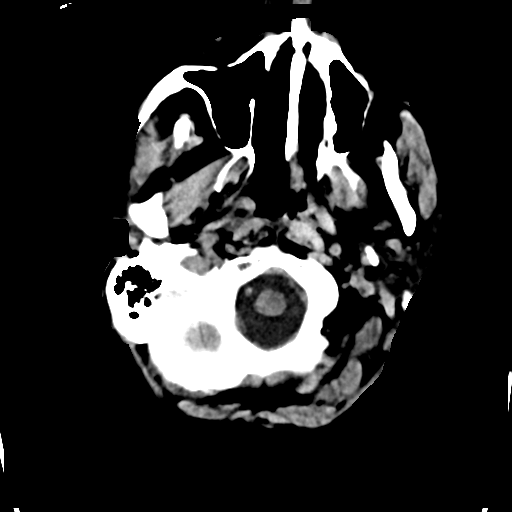
[im 5/35  bone]
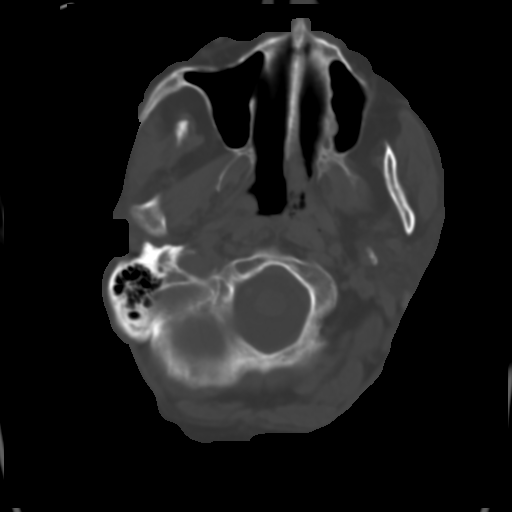
[im 9/35  brain]
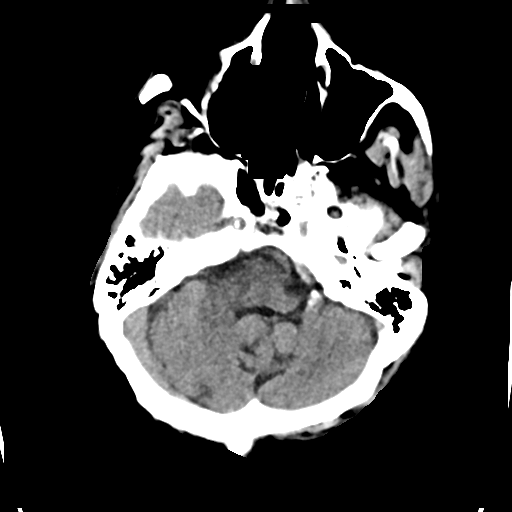
[im 13/35  brain]
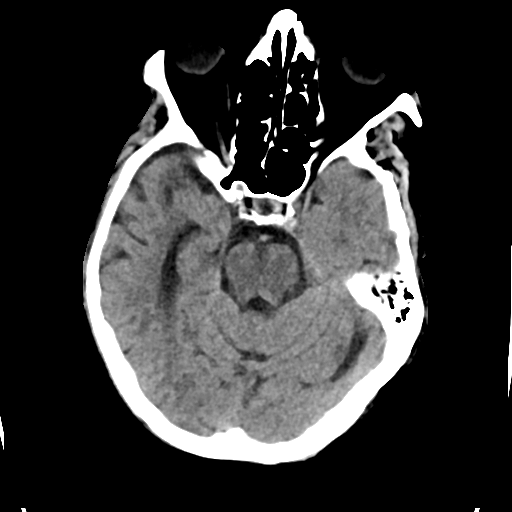
[im 18/35  brain]
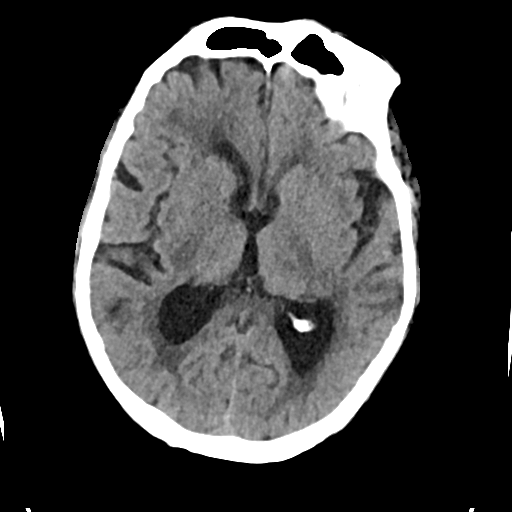
[im 22/35  brain]
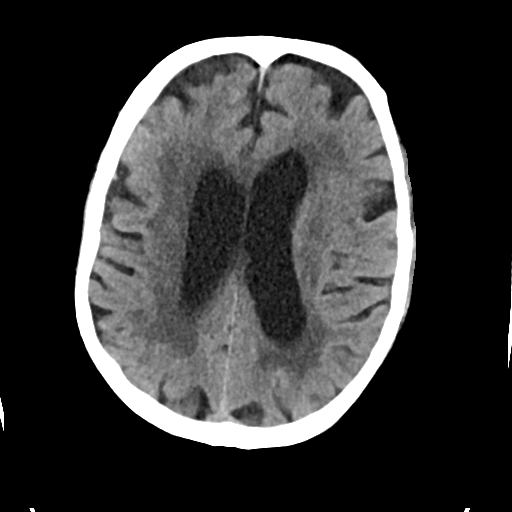
[im 22/35  bone]
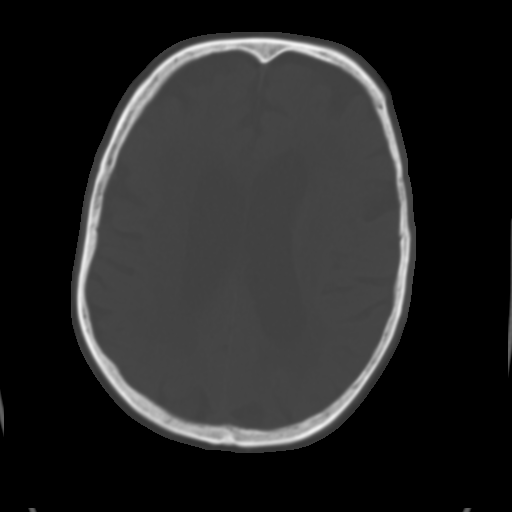
[im 26/35  brain]
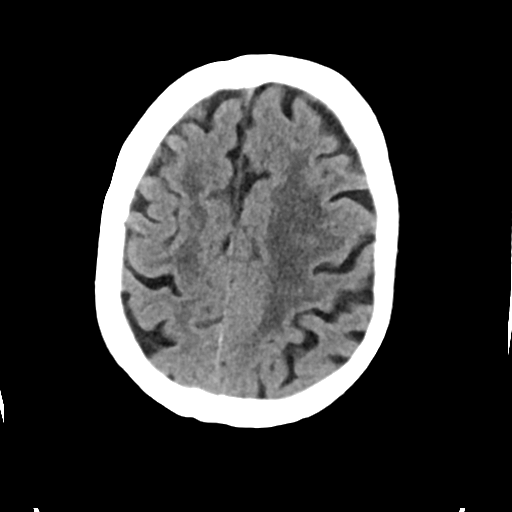
[im 30/35  brain]
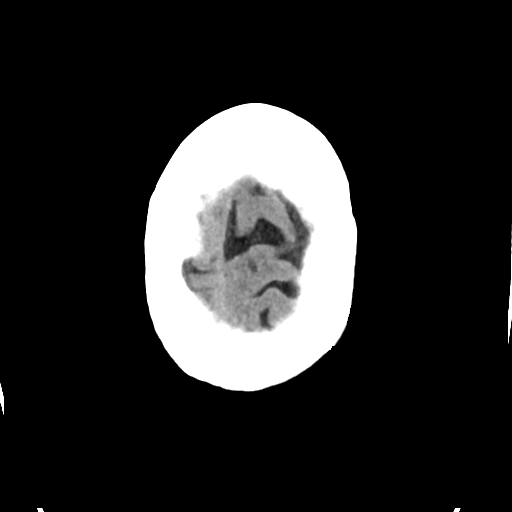

[Series 5: cor soft · coronal · 0.34mm/px · 3 of 78 slices shown]
[im 26/78  brain]
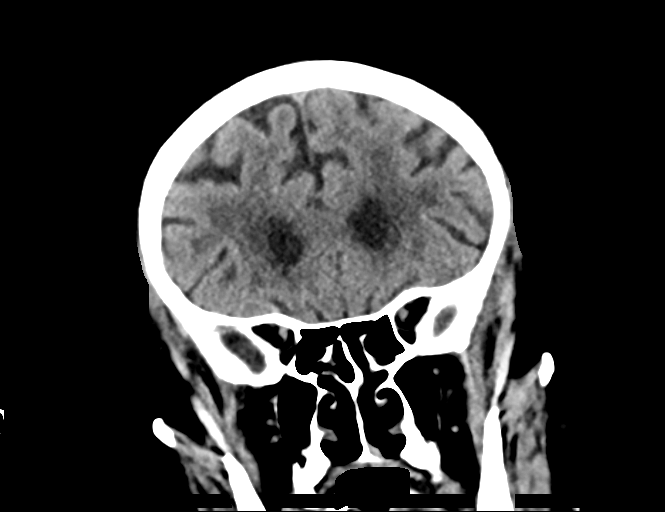
[im 35/78  brain]
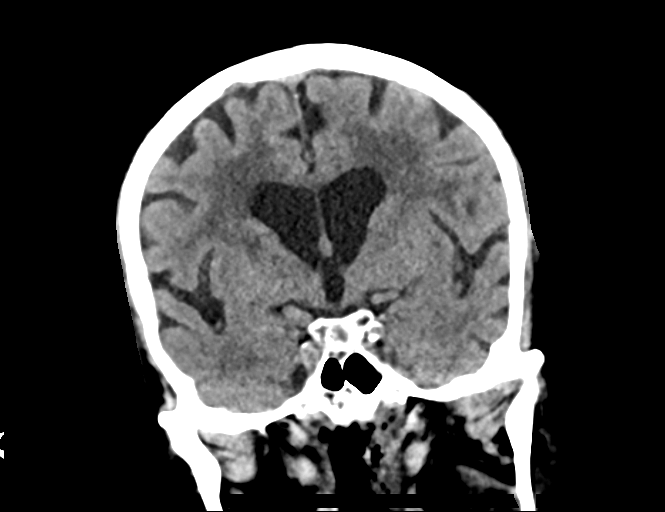
[im 43/78  brain]
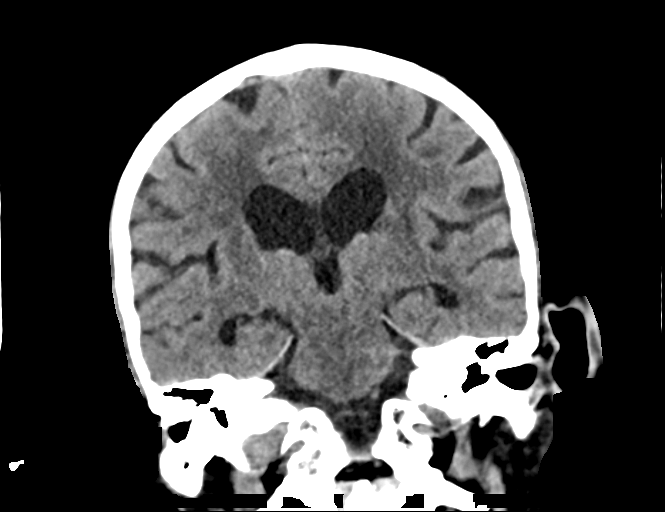

[Series 6: sag soft · sagittal · 0.34mm/px · 3 of 66 slices shown]
[im 22/66  brain]
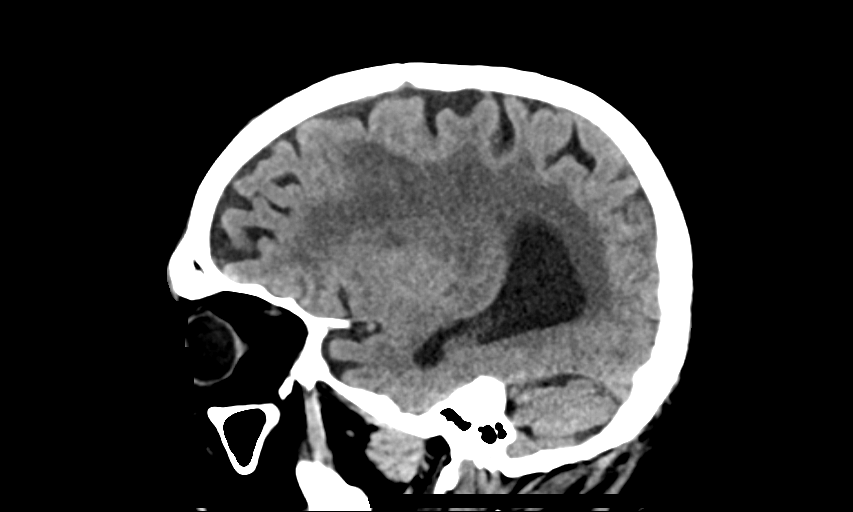
[im 33/66  brain]
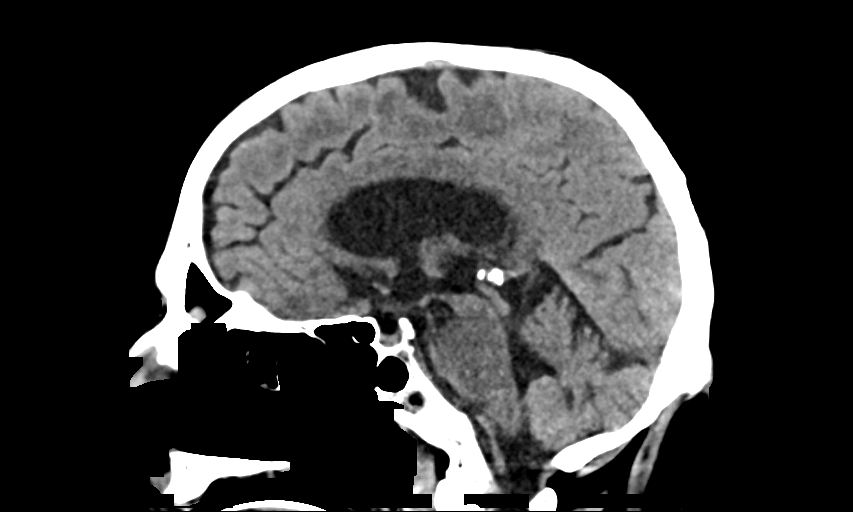
[im 44/66  brain]
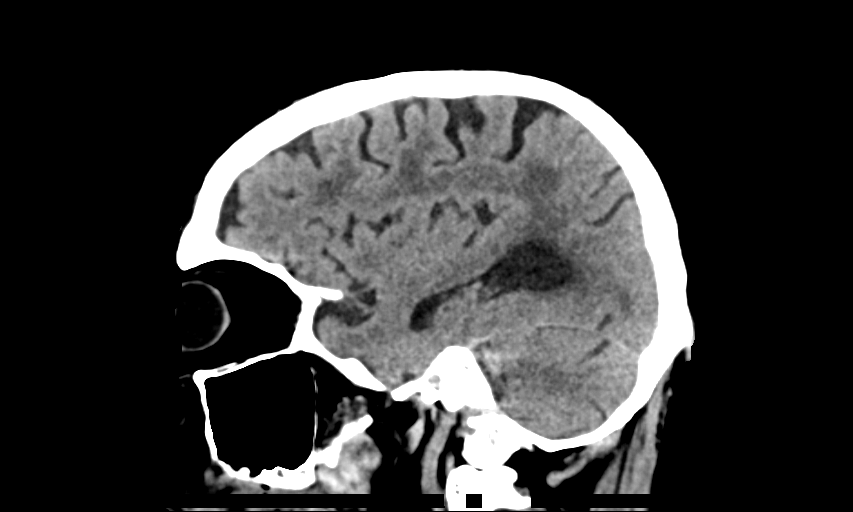

[13 of 47 positions shown; findings below may reference images not displayed]

FINDINGS: Brain: No evidence of acute infarction, hemorrhage, hydrocephalus,
extra-axial collection or mass lesion / mass effect.

Prominence of the ventricles and sulci reflects mild cortical volume
loss. Mild cerebellar atrophy is noted. Scattered periventricular
subcortical white matter change likely reflects small vessel
ischemic microangiopathy.

The brainstem and fourth ventricle are within normal limits. The
basal ganglia are unremarkable in appearance. The cerebral
hemispheres demonstrate grossly normal gray-white differentiation.
No mass effect or midline shift is seen.

Vascular: No hyperdense vessel or unexpected calcification.

Skull: There is no evidence of fracture; visualized osseous
structures are unremarkable in appearance.

Sinuses/Orbits: The orbits are within normal limits. The paranasal
sinuses and mastoid air cells are well-aerated.

Other: No significant soft tissue abnormalities are seen.
IMPRESSION: 1. No acute intracranial pathology seen on CT.
2. Mild cortical volume loss and scattered small vessel ischemic
microangiopathy.
